# Patient Record
Sex: Female | Born: 1960 | Race: White | Hispanic: No | Smoking: Current every day smoker
Health system: Southern US, Community
[De-identification: ages and names within clinical notes are randomized; demographics above are authoritative.]

## PROBLEM LIST (undated history)

## (undated) DIAGNOSIS — K219 Gastro-esophageal reflux disease without esophagitis: Secondary | ICD-10-CM

## (undated) DIAGNOSIS — I10 Essential (primary) hypertension: Secondary | ICD-10-CM

## (undated) DIAGNOSIS — R112 Nausea with vomiting, unspecified: Secondary | ICD-10-CM

## (undated) DIAGNOSIS — J449 Chronic obstructive pulmonary disease, unspecified: Secondary | ICD-10-CM

## (undated) DIAGNOSIS — G8929 Other chronic pain: Secondary | ICD-10-CM

## (undated) DIAGNOSIS — T8859XA Other complications of anesthesia, initial encounter: Secondary | ICD-10-CM

## (undated) DIAGNOSIS — F419 Anxiety disorder, unspecified: Secondary | ICD-10-CM

## (undated) DIAGNOSIS — M1990 Unspecified osteoarthritis, unspecified site: Secondary | ICD-10-CM

## (undated) DIAGNOSIS — R519 Headache, unspecified: Secondary | ICD-10-CM

## (undated) DIAGNOSIS — Z9889 Other specified postprocedural states: Secondary | ICD-10-CM

## (undated) DIAGNOSIS — T4145XA Adverse effect of unspecified anesthetic, initial encounter: Secondary | ICD-10-CM

## (undated) HISTORY — DX: Gastro-esophageal reflux disease without esophagitis: K21.9

## (undated) HISTORY — PX: APPENDECTOMY: SHX54

## (undated) HISTORY — DX: Anxiety disorder, unspecified: F41.9

## (undated) HISTORY — PX: EXPLORATORY LAPAROTOMY: SUR591

## (undated) HISTORY — PX: TONSILLECTOMY: SUR1361

## (undated) HISTORY — DX: Chronic obstructive pulmonary disease, unspecified: J44.9

## (undated) HISTORY — DX: Other chronic pain: G89.29

## (undated) HISTORY — PX: MANDIBLE FRACTURE SURGERY: SHX706

## (undated) HISTORY — DX: Headache, unspecified: R51.9

---

## 1997-11-06 ENCOUNTER — Emergency Department (HOSPITAL_COMMUNITY): Admission: EM | Admit: 1997-11-06 | Discharge: 1997-11-06 | Payer: Self-pay | Admitting: Emergency Medicine

## 1998-07-25 ENCOUNTER — Inpatient Hospital Stay (HOSPITAL_COMMUNITY): Admission: AD | Admit: 1998-07-25 | Discharge: 1998-07-25 | Payer: Self-pay | Admitting: Obstetrics

## 2011-06-03 ENCOUNTER — Inpatient Hospital Stay (HOSPITAL_COMMUNITY)
Admission: AD | Admit: 2011-06-03 | Discharge: 2011-06-03 | Disposition: A | Discharge status: Home / Self Care | Payer: Self-pay | Source: Ambulatory Visit | Attending: Obstetrics and Gynecology | Admitting: Obstetrics and Gynecology

## 2011-06-03 ENCOUNTER — Encounter (HOSPITAL_COMMUNITY): Payer: Self-pay

## 2011-06-03 DIAGNOSIS — I10 Essential (primary) hypertension: Secondary | ICD-10-CM | POA: Insufficient documentation

## 2011-06-03 DIAGNOSIS — A5901 Trichomonal vulvovaginitis: Secondary | ICD-10-CM | POA: Insufficient documentation

## 2011-06-03 DIAGNOSIS — N938 Other specified abnormal uterine and vaginal bleeding: Secondary | ICD-10-CM | POA: Insufficient documentation

## 2011-06-03 DIAGNOSIS — N390 Urinary tract infection, site not specified: Secondary | ICD-10-CM | POA: Insufficient documentation

## 2011-06-03 DIAGNOSIS — N949 Unspecified condition associated with female genital organs and menstrual cycle: Secondary | ICD-10-CM | POA: Insufficient documentation

## 2011-06-03 HISTORY — DX: Nausea with vomiting, unspecified: R11.2

## 2011-06-03 HISTORY — DX: Other complications of anesthesia, initial encounter: T88.59XA

## 2011-06-03 HISTORY — DX: Adverse effect of unspecified anesthetic, initial encounter: T41.45XA

## 2011-06-03 HISTORY — DX: Other specified postprocedural states: Z98.890

## 2011-06-03 HISTORY — DX: Essential (primary) hypertension: I10

## 2011-06-03 LAB — URINE MICROSCOPIC-ADD ON

## 2011-06-03 LAB — CBC
Hemoglobin: 14.2 g/dL (ref 12.0–15.0)
MCH: 30.6 pg (ref 26.0–34.0)
MCV: 92.5 fL (ref 78.0–100.0)
RBC: 4.64 MIL/uL (ref 3.87–5.11)

## 2011-06-03 LAB — URINALYSIS, ROUTINE W REFLEX MICROSCOPIC
Bilirubin Urine: NEGATIVE
Glucose, UA: NEGATIVE mg/dL
Specific Gravity, Urine: <1.005 — ABNORMAL LOW (ref 1.005–1.030)
pH: 6 (ref 5.0–8.0)

## 2011-06-03 LAB — HCG, SERUM, QUALITATIVE: Preg, Serum: NEGATIVE

## 2011-06-03 MED ORDER — ONDANSETRON 8 MG PO TBDP
8.0000 mg | ORAL_TABLET | Freq: Once | ORAL | Status: AC
  Administered 2011-06-03: 8 mg via ORAL
  Filled 2011-06-03: qty 1

## 2011-06-03 MED ORDER — FLUCONAZOLE 150 MG PO TABS: 150.0000 mg | ORAL_TABLET | Freq: Once | ORAL | Status: AC

## 2011-06-03 MED ORDER — HYDROCHLOROTHIAZIDE 12.5 MG PO CAPS
12.5000 mg | ORAL_CAPSULE | Freq: Once | ORAL | Status: AC
  Administered 2011-06-03: 12.5 mg via ORAL
  Filled 2011-06-03: qty 1

## 2011-06-03 MED ORDER — METRONIDAZOLE 500 MG PO TABS
2000.0000 mg | ORAL_TABLET | Freq: Once | ORAL | Status: DC
  Filled 2011-06-03: qty 4

## 2011-06-03 MED ORDER — HYDROCHLOROTHIAZIDE 12.5 MG PO CAPS: 12.5000 mg | ORAL_CAPSULE | Freq: Every day | ORAL | Status: DC

## 2011-06-03 MED ORDER — KETOROLAC TROMETHAMINE 60 MG/2ML IM SOLN
60.0000 mg | Freq: Once | INTRAMUSCULAR | Status: AC
  Administered 2011-06-03: 60 mg via INTRAMUSCULAR
  Filled 2011-06-03: qty 2

## 2011-06-03 MED ORDER — SULFAMETHOXAZOLE-TRIMETHOPRIM 800-160 MG PO TABS: 1.0000 | ORAL_TABLET | Freq: Two times a day (BID) | ORAL | Status: AC

## 2011-06-03 NOTE — Progress Notes (Signed)
Pain started low back just on left side. Started about a month an a half ago.  Started having discharge about a month ago, was milky colored, then started looking like infection, smelling like infection.  Pinkish on Tues when wiped (has not bled in 2 yrs).  Blood pouring out Tues night, has been getting lighter since, just spotting today.  Now pain is lower left back and cramping in the front. Today is nauseated. (neg CVA tenderness)

## 2011-06-03 NOTE — ED Provider Notes (Signed)
History     Chief Complaint  Patient presents with  . Back Pain  . Vaginal Discharge  . Vaginal Bleeding  . Abdominal Cramping   HPI Cortnee Steinmiller 51 y.o. had bleeding earlier this week.  Has been menopausal for over one year.  Has had heavy vaginal discharge with odor for over one month. Having lower abdominal pain and back pain.  Thinks she has a kidney infection.     OB History    Grav Para Term Preterm Abortions TAB SAB Ect Mult Living   1 1 1  0 0 0 0 0 0 1      Past Medical History  Diagnosis Date  . Hypertension     HBP at [employer] machine but never been to doctor for it.   . Complication of anesthesia   . PONV (postoperative nausea and vomiting)     Past Surgical History  Procedure Date  . Appendectomy   . Tonsillectomy   . Mandible fracture surgery     Family History  Problem Relation Age of Onset  . Anesthesia problems Neg Hx     History  Substance Use Topics  . Smoking status: Current Everyday Smoker    Types: Cigarettes  . Smokeless tobacco: Not on file  . Alcohol Use: Yes    Allergies:  Allergies  Allergen Reactions  . Vicodin (Hydrocodone-Acetaminophen) Itching    Prescriptions prior to admission  Medication Sig Dispense Refill  . CRANBERRY PO Take 1 tablet by mouth daily.      . Ibuprofen-Diphenhydramine Cit (IBUPROFEN PM PO) Take 1 tablet by mouth at bedtime as needed. Takes for sleep        Review of Systems  Constitutional: Negative for fever.  Gastrointestinal: Positive for nausea and abdominal pain. Negative for vomiting.  Genitourinary:       Vaginal discharge Vaginal bleeding  Musculoskeletal: Positive for back pain.   Physical Exam   Blood pressure 150/98, pulse 76, temperature 98.6 F (37 C), temperature source Oral, resp. rate 20, height 5\' 2"  (1.575 m), weight 150 lb (68.04 kg), SpO2 99.00%.  Physical Exam  Nursing note and vitals reviewed. Constitutional: She is oriented to person, place, and time. She appears  well-developed and well-nourished.  HENT:  Head: Normocephalic.  Eyes: EOM are normal.  Neck: Neck supple.  GI: Soft. There is tenderness. There is no rebound and no guarding.       Mild diffuse lower abdominal tenderness  Genitourinary:       Speculum exam: Vagina - Mod amount of yellow liquid discharge, no odor Cervix - No contact bleeding Bimanual exam: Cervix closed Uterus non tender, normal size Adnexa non tender, no masses bilaterally GC/Chlam, wet prep done Chaperone present for exam.  Musculoskeletal: Normal range of motion.  Neurological: She is alert and oriented to person, place, and time.  Skin: Skin is warm and dry.  Psychiatric: She has a normal mood and affect.    MAU Course  Procedures Results for orders placed during the hospital encounter of 06/03/11 (from the past 24 hour(s))  URINALYSIS, ROUTINE W REFLEX MICROSCOPIC     Status: Abnormal   Collection Time   06/03/11  6:20 PM      Component Value Range   Color, Urine STRAW (*) YELLOW    APPearance CLEAR  CLEAR    Specific Gravity, Urine <1.005 (*) 1.005 - 1.030    pH 6.0  5.0 - 8.0    Glucose, UA NEGATIVE  NEGATIVE (mg/dL)   Hgb  urine dipstick MODERATE (*) NEGATIVE    Bilirubin Urine NEGATIVE  NEGATIVE    Ketones, ur NEGATIVE  NEGATIVE (mg/dL)   Protein, ur NEGATIVE  NEGATIVE (mg/dL)   Urobilinogen, UA 0.2  0.0 - 1.0 (mg/dL)   Nitrite NEGATIVE  NEGATIVE    Leukocytes, UA SMALL (*) NEGATIVE   URINE MICROSCOPIC-ADD ON     Status: Abnormal   Collection Time   06/03/11  6:20 PM      Component Value Range   Squamous Epithelial / LPF FEW (*) RARE    WBC, UA 11-20  <3 (WBC/hpf)   RBC / HPF 3-6  <3 (RBC/hpf)   Bacteria, UA MANY (*) RARE    Urine-Other TRICHOMONAS PRESENT    CBC     Status: Normal   Collection Time   06/03/11  8:31 PM      Component Value Range   WBC 8.7  4.0 - 10.5 (K/uL)   RBC 4.64  3.87 - 5.11 (MIL/uL)   Hemoglobin 14.2  12.0 - 15.0 (g/dL)   HCT 01.0  27.2 - 53.6 (%)   MCV 92.5  78.0 -  100.0 (fL)   MCH 30.6  26.0 - 34.0 (pg)   MCHC 33.1  30.0 - 36.0 (g/dL)   RDW 64.4  03.4 - 74.2 (%)   Platelets 221  150 - 400 (K/uL)  HCG, SERUM, QUALITATIVE     Status: Normal   Collection Time   06/03/11  8:31 PM      Component Value Range   Preg, Serum NEGATIVE  NEGATIVE   WET PREP, GENITAL     Status: Abnormal   Collection Time   06/03/11  8:46 PM      Component Value Range   Yeast Wet Prep HPF POC NONE SEEN  NONE SEEN    Trich, Wet Prep MANY (*) NONE SEEN    Clue Cells Wet Prep HPF POC RARE (*) NONE SEEN    WBC, Wet Prep HPF POC FEW (*) NONE SEEN     MDM Has pressure when palpating bladder.  Thinks she has a kidney infection but reassured as she has no CVA tenderness.  Will treat for UTI as she has pressure over bladder on bimanual.  Blood pressure went up to 210/114 after getting STD diagnosis.  Will begin to treat BP.  Client has already expressed she does not have money for medication so will give HCTz as it is inexpensive and will begin to treat BP although she may need other medications for control of BP.  Was given zofran one dose in MAU.  Assessment and Plan  Trichomonas UTI Hypertension  Plan rx septra ds one po bid x 3 days for uti rx HCTZ 12.5 mg po q day rx diflucan 150 mg one po as a single dose if develops a yeast infection. Treatment given for trich while in MAU Discussed referring partner for treatment No sex for 10 days Advised to [facility] of hypertension Will send message to GYN clinic as client has not had pap in years.  BURLESON,TERRI 06/03/2011, 8:44 PM   Nolene Bernheim, NP 06/03/11 2247

## 2011-06-04 LAB — GC/CHLAMYDIA PROBE AMP, GENITAL: GC Probe Amp, Genital: NEGATIVE

## 2011-06-27 ENCOUNTER — Encounter: Payer: Self-pay | Admitting: Advanced Practice Midwife

## 2013-03-23 ENCOUNTER — Emergency Department (HOSPITAL_COMMUNITY): Payer: Self-pay

## 2013-03-23 ENCOUNTER — Encounter (HOSPITAL_COMMUNITY): Payer: Self-pay | Admitting: Emergency Medicine

## 2013-03-23 ENCOUNTER — Inpatient Hospital Stay (HOSPITAL_COMMUNITY)
Admission: EM | Admit: 2013-03-23 | Discharge: 2013-03-24 | DRG: 392 | Disposition: A | Discharge status: Home / Self Care | Payer: Self-pay | Attending: Family Medicine | Admitting: Family Medicine

## 2013-03-23 DIAGNOSIS — K2970 Gastritis, unspecified, without bleeding: Principal | ICD-10-CM | POA: Diagnosis present

## 2013-03-23 DIAGNOSIS — K5900 Constipation, unspecified: Secondary | ICD-10-CM | POA: Diagnosis present

## 2013-03-23 DIAGNOSIS — I10 Essential (primary) hypertension: Secondary | ICD-10-CM | POA: Diagnosis present

## 2013-03-23 DIAGNOSIS — E876 Hypokalemia: Secondary | ICD-10-CM | POA: Diagnosis present

## 2013-03-23 DIAGNOSIS — E860 Dehydration: Secondary | ICD-10-CM

## 2013-03-23 DIAGNOSIS — F329 Major depressive disorder, single episode, unspecified: Secondary | ICD-10-CM | POA: Diagnosis present

## 2013-03-23 DIAGNOSIS — F3289 Other specified depressive episodes: Secondary | ICD-10-CM | POA: Diagnosis present

## 2013-03-23 DIAGNOSIS — F1010 Alcohol abuse, uncomplicated: Secondary | ICD-10-CM | POA: Diagnosis present

## 2013-03-23 DIAGNOSIS — F411 Generalized anxiety disorder: Secondary | ICD-10-CM | POA: Diagnosis present

## 2013-03-23 DIAGNOSIS — F17200 Nicotine dependence, unspecified, uncomplicated: Secondary | ICD-10-CM | POA: Diagnosis present

## 2013-03-23 DIAGNOSIS — K2920 Alcoholic gastritis without bleeding: Secondary | ICD-10-CM

## 2013-03-23 LAB — CBC WITH DIFFERENTIAL/PLATELET
Basophils Relative: 1 % (ref 0–1)
HCT: 47.1 % — ABNORMAL HIGH (ref 36.0–46.0)
Hemoglobin: 17.1 g/dL — ABNORMAL HIGH (ref 12.0–15.0)
Lymphs Abs: 1.7 10*3/uL (ref 0.7–4.0)
MCH: 31.6 pg (ref 26.0–34.0)
MCHC: 36.3 g/dL — ABNORMAL HIGH (ref 30.0–36.0)
Monocytes Absolute: 0.9 10*3/uL (ref 0.1–1.0)
Monocytes Relative: 10 % (ref 3–12)
Neutro Abs: 6.3 10*3/uL (ref 1.7–7.7)
RBC: 5.41 MIL/uL — ABNORMAL HIGH (ref 3.87–5.11)

## 2013-03-23 LAB — COMPREHENSIVE METABOLIC PANEL
Albumin: 4.6 g/dL (ref 3.5–5.2)
BUN: 9 mg/dL (ref 6–23)
Chloride: 97 mEq/L (ref 96–112)
Creatinine, Ser: 0.86 mg/dL (ref 0.50–1.10)
GFR calc Af Amer: 88 mL/min — ABNORMAL LOW (ref >90)
GFR calc non Af Amer: 76 mL/min — ABNORMAL LOW (ref >90)
Glucose, Bld: 81 mg/dL (ref 70–99)
Total Bilirubin: 0.7 mg/dL (ref 0.3–1.2)

## 2013-03-23 LAB — URINE MICROSCOPIC-ADD ON

## 2013-03-23 LAB — LIPASE, BLOOD: Lipase: 24 U/L (ref 11–59)

## 2013-03-23 LAB — POCT I-STAT TROPONIN I: Troponin i, poc: 0 ng/mL (ref 0.00–0.08)

## 2013-03-23 LAB — URINALYSIS, ROUTINE W REFLEX MICROSCOPIC
Bilirubin Urine: NEGATIVE
Ketones, ur: NEGATIVE mg/dL
Leukocytes, UA: NEGATIVE
Nitrite: NEGATIVE
Protein, ur: NEGATIVE mg/dL
Urobilinogen, UA: 1 mg/dL (ref 0.0–1.0)

## 2013-03-23 LAB — POCT PREGNANCY, URINE: Preg Test, Ur: NEGATIVE

## 2013-03-23 IMAGING — CR DG ABDOMEN ACUTE W/ 1V CHEST
3 series · 3 of 3 positions shown · non-contrast
Comparison: None.

CLINICAL DATA: Abdominal pain.

EXAM:
ACUTE ABDOMEN SERIES (ABDOMEN 2 VIEW & CHEST 1 VIEW)

[w chest pa]
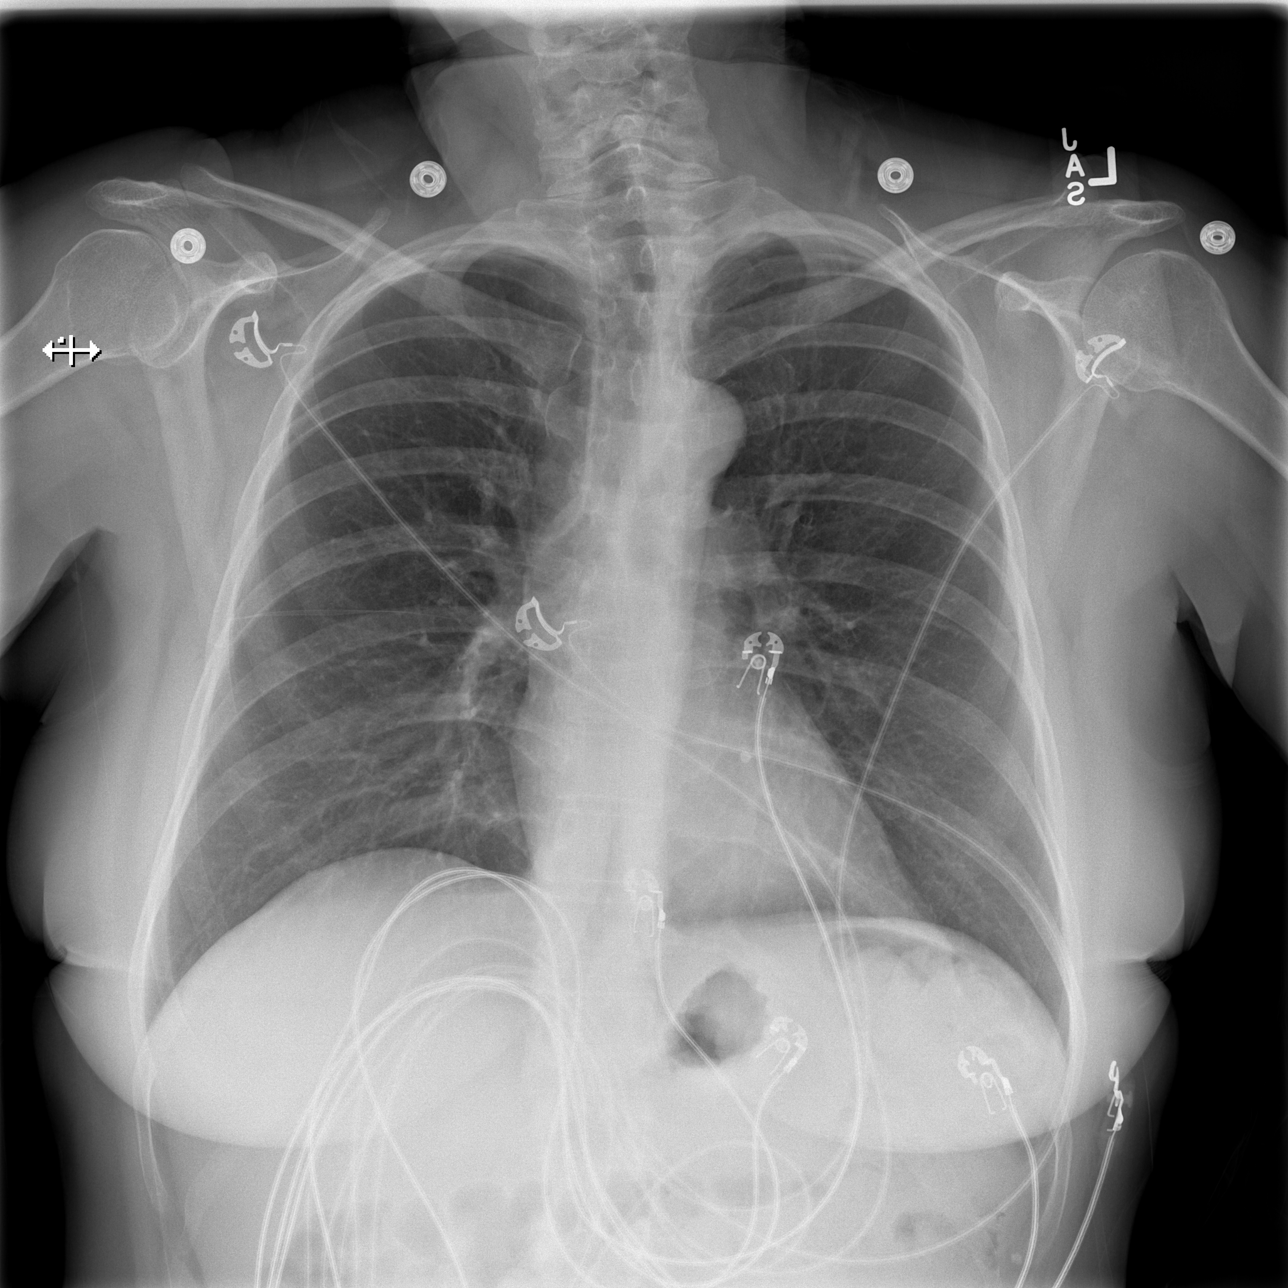

[w abdomen upright]
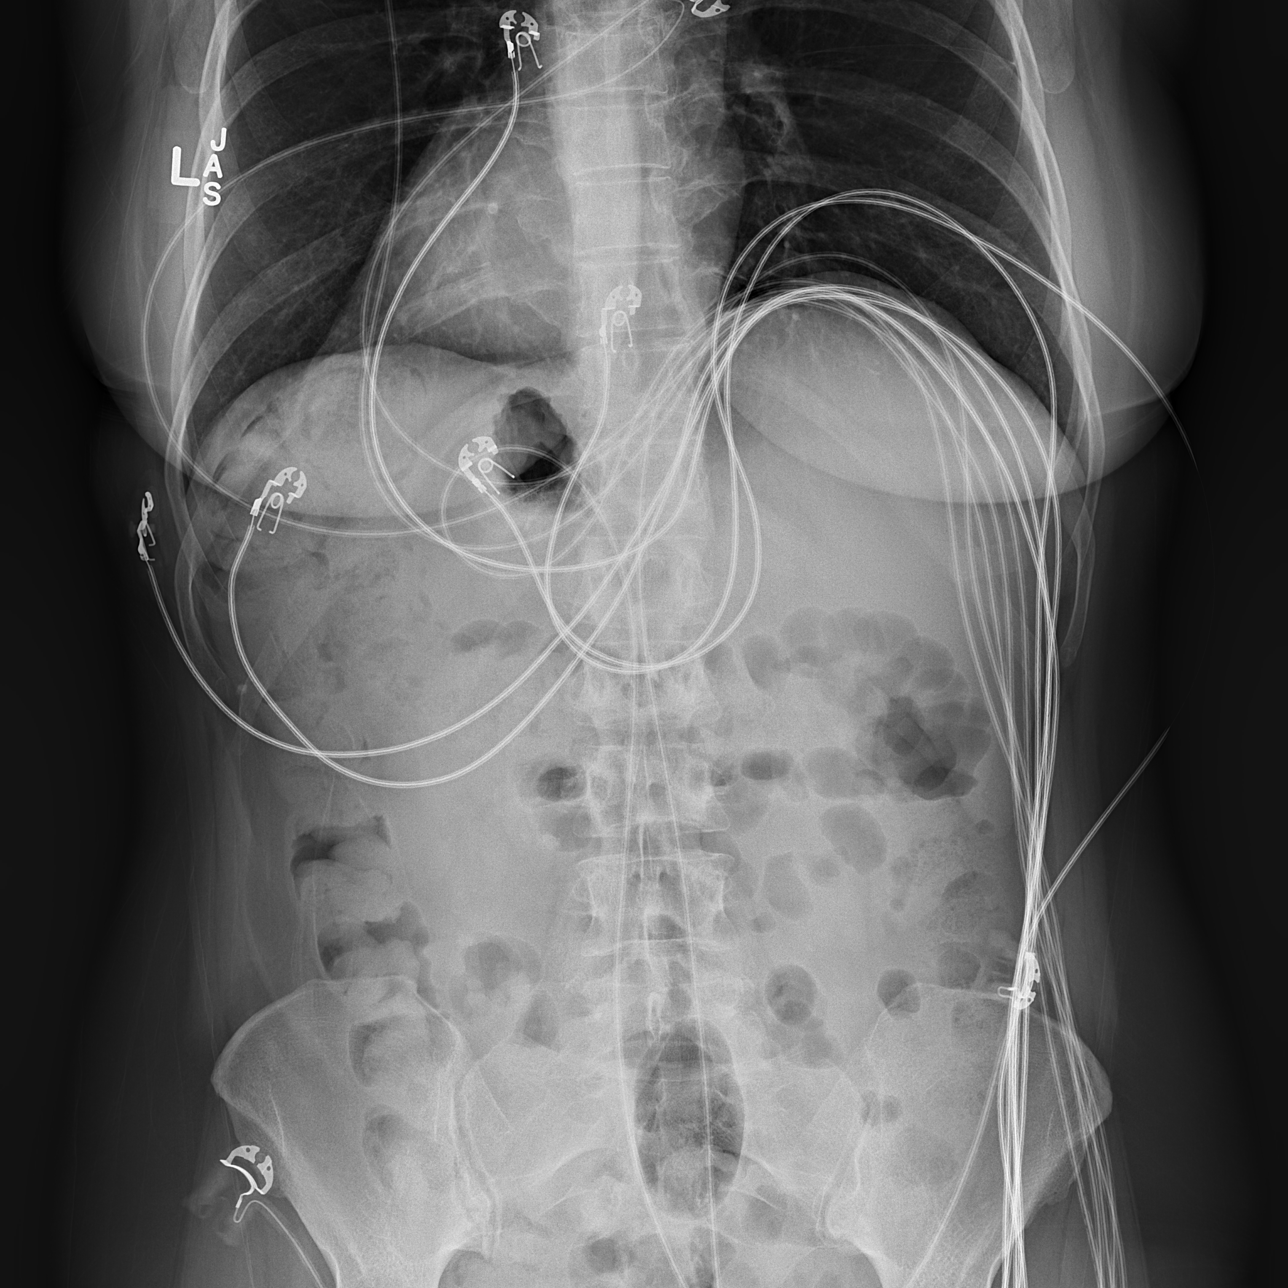

[x abdomen supine]
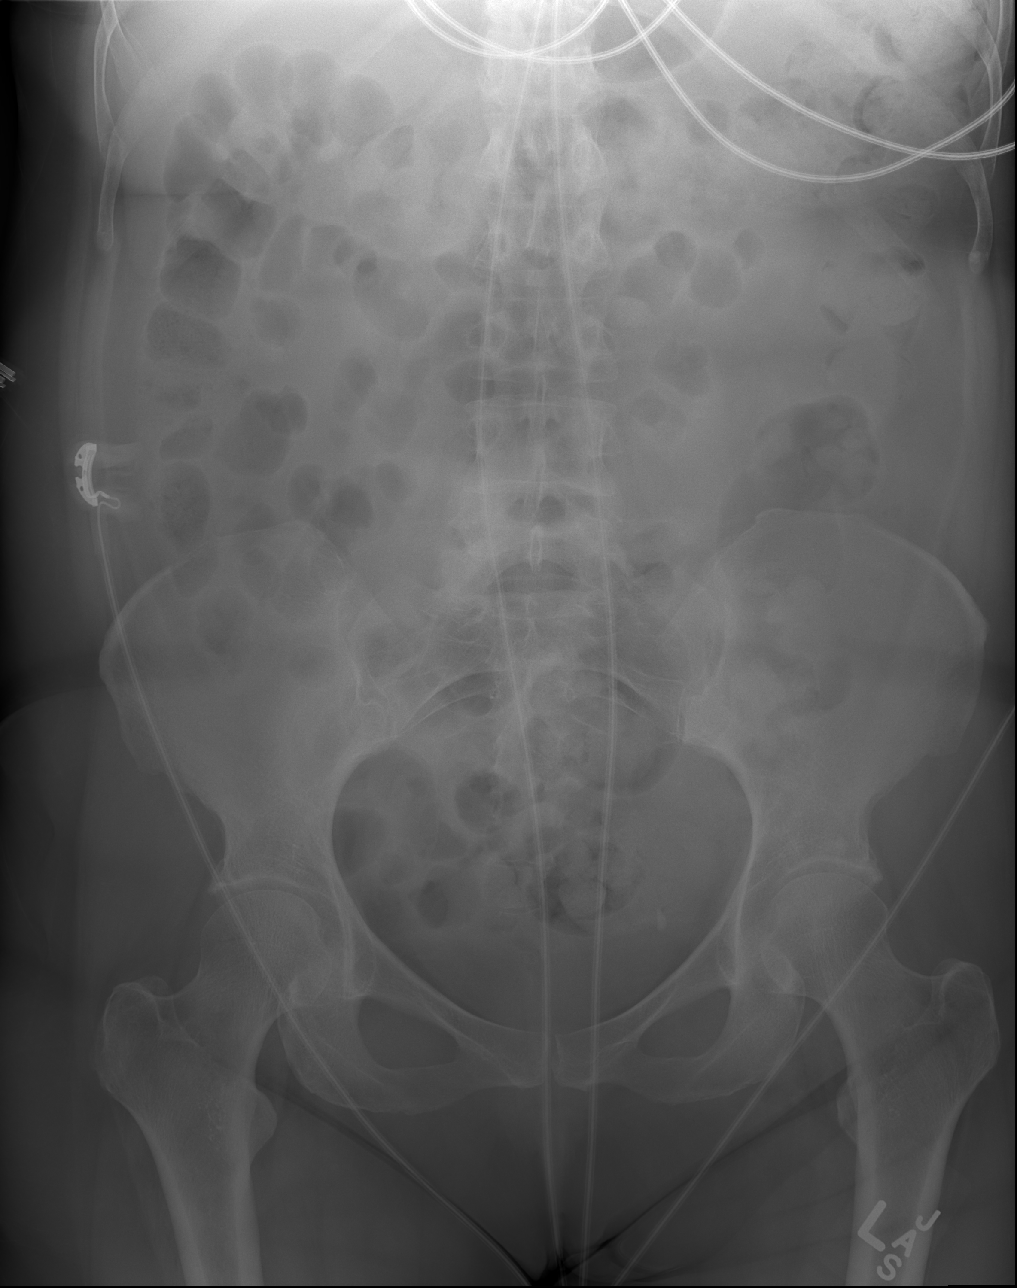

[3 of 3 positions shown; findings below may reference images not displayed]

FINDINGS: There is no evidence of dilated bowel loops or free intraperitoneal
air. No radiopaque calculi or other significant radiographic
abnormality is seen. Heart size and mediastinal contours are within
normal limits. Both lungs are clear.
IMPRESSION: Negative abdominal radiographs.  No acute cardiopulmonary disease.

## 2013-03-23 MED ORDER — ADULT MULTIVITAMIN W/MINERALS CH
1.0000 | ORAL_TABLET | Freq: Every day | ORAL | Status: DC
  Administered 2013-03-23 – 2013-03-24 (×2): 1 via ORAL
  Filled 2013-03-23 (×2): qty 1

## 2013-03-23 MED ORDER — TRAZODONE HCL 50 MG PO TABS: 50.0000 mg | ORAL_TABLET | Freq: Every day | ORAL | Status: DC

## 2013-03-23 MED ORDER — TRAZODONE HCL 50 MG PO TABS
50.0000 mg | ORAL_TABLET | Freq: Every day | ORAL | Status: DC
  Administered 2013-03-23: 50 mg via ORAL
  Filled 2013-03-23 (×2): qty 1

## 2013-03-23 MED ORDER — ONDANSETRON HCL 4 MG/2ML IJ SOLN
4.0000 mg | Freq: Four times a day (QID) | INTRAMUSCULAR | Status: DC | PRN
  Administered 2013-03-23 – 2013-03-24 (×2): 4 mg via INTRAVENOUS
  Filled 2013-03-23 (×2): qty 2

## 2013-03-23 MED ORDER — LORAZEPAM 1 MG PO TABS
1.0000 mg | ORAL_TABLET | Freq: Four times a day (QID) | ORAL | Status: DC | PRN
  Administered 2013-03-23 – 2013-03-24 (×2): 1 mg via ORAL
  Filled 2013-03-23 (×2): qty 1

## 2013-03-23 MED ORDER — POTASSIUM CHLORIDE 10 MEQ/100ML IV SOLN
10.0000 meq | INTRAVENOUS | Status: DC
  Administered 2013-03-23: 10 meq via INTRAVENOUS
  Filled 2013-03-23 (×3): qty 100

## 2013-03-23 MED ORDER — ACETAMINOPHEN 325 MG PO TABS
650.0000 mg | ORAL_TABLET | Freq: Four times a day (QID) | ORAL | Status: DC | PRN
  Administered 2013-03-23 – 2013-03-24 (×2): 650 mg via ORAL
  Filled 2013-03-23 (×2): qty 2

## 2013-03-23 MED ORDER — AMLODIPINE BESYLATE 10 MG PO TABS: 10.0000 mg | ORAL_TABLET | Freq: Every day | ORAL | Status: DC

## 2013-03-23 MED ORDER — POTASSIUM CHLORIDE CRYS ER 20 MEQ PO TBCR
40.0000 meq | EXTENDED_RELEASE_TABLET | Freq: Once | ORAL | Status: AC
  Administered 2013-03-23: 40 meq via ORAL
  Filled 2013-03-23: qty 2

## 2013-03-23 MED ORDER — HEPARIN SODIUM (PORCINE) 5000 UNIT/ML IJ SOLN
5000.0000 [IU] | Freq: Three times a day (TID) | INTRAMUSCULAR | Status: DC
  Administered 2013-03-23 – 2013-03-24 (×4): 5000 [IU] via SUBCUTANEOUS
  Filled 2013-03-23 (×6): qty 1

## 2013-03-23 MED ORDER — DIPHENHYDRAMINE HCL 50 MG/ML IJ SOLN
25.0000 mg | Freq: Once | INTRAMUSCULAR | Status: AC
  Administered 2013-03-23: 25 mg via INTRAVENOUS
  Filled 2013-03-23: qty 1

## 2013-03-23 MED ORDER — LORAZEPAM 2 MG/ML IJ SOLN: 1.0000 mg | Freq: Four times a day (QID) | INTRAMUSCULAR | Status: DC | PRN

## 2013-03-23 MED ORDER — FOLIC ACID 1 MG PO TABS
1.0000 mg | ORAL_TABLET | Freq: Every day | ORAL | Status: DC
  Administered 2013-03-23 – 2013-03-24 (×2): 1 mg via ORAL
  Filled 2013-03-23 (×2): qty 1

## 2013-03-23 MED ORDER — METOCLOPRAMIDE HCL 5 MG/ML IJ SOLN
10.0000 mg | Freq: Once | INTRAMUSCULAR | Status: AC
  Administered 2013-03-23: 10 mg via INTRAVENOUS
  Filled 2013-03-23: qty 2

## 2013-03-23 MED ORDER — ONDANSETRON HCL 4 MG/2ML IJ SOLN: 4.0000 mg | Freq: Once | INTRAMUSCULAR | Status: DC

## 2013-03-23 MED ORDER — THIAMINE HCL 100 MG/ML IJ SOLN
100.0000 mg | Freq: Every day | INTRAMUSCULAR | Status: DC
  Filled 2013-03-23: qty 1

## 2013-03-23 MED ORDER — POTASSIUM CHLORIDE 10 MEQ/100ML IV SOLN
10.0000 meq | INTRAVENOUS | Status: DC
  Administered 2013-03-23 (×2): 10 meq via INTRAVENOUS
  Filled 2013-03-23 (×2): qty 100

## 2013-03-23 MED ORDER — PANTOPRAZOLE SODIUM 40 MG IV SOLR
40.0000 mg | Freq: Once | INTRAVENOUS | Status: AC
  Administered 2013-03-23: 40 mg via INTRAVENOUS
  Filled 2013-03-23: qty 40

## 2013-03-23 MED ORDER — FAMOTIDINE IN NACL 20-0.9 MG/50ML-% IV SOLN
20.0000 mg | Freq: Once | INTRAVENOUS | Status: AC
  Administered 2013-03-23: 20 mg via INTRAVENOUS
  Filled 2013-03-23: qty 50

## 2013-03-23 MED ORDER — VITAMIN B-1 100 MG PO TABS
100.0000 mg | ORAL_TABLET | Freq: Every day | ORAL | Status: DC
  Administered 2013-03-23 – 2013-03-24 (×2): 100 mg via ORAL
  Filled 2013-03-23 (×2): qty 1

## 2013-03-23 MED ORDER — ONDANSETRON HCL 4 MG PO TABS: 4.0000 mg | ORAL_TABLET | Freq: Four times a day (QID) | ORAL | Status: DC | PRN

## 2013-03-23 MED ORDER — SODIUM CHLORIDE 0.9 % IV BOLUS (SEPSIS)
1000.0000 mL | Freq: Once | INTRAVENOUS | Status: AC
  Administered 2013-03-23: 1000 mL via INTRAVENOUS

## 2013-03-23 MED ORDER — PANTOPRAZOLE SODIUM 40 MG IV SOLR
40.0000 mg | Freq: Two times a day (BID) | INTRAVENOUS | Status: DC
  Administered 2013-03-23: 40 mg via INTRAVENOUS
  Filled 2013-03-23 (×3): qty 40

## 2013-03-23 MED ORDER — DEXTROSE-NACL 5-0.45 % IV SOLN
INTRAVENOUS | Status: DC
  Administered 2013-03-23 – 2013-03-24 (×2): via INTRAVENOUS
  Administered 2013-03-24: 10 mL/h via INTRAVENOUS

## 2013-03-23 MED ORDER — GI COCKTAIL ~~LOC~~
30.0000 mL | Freq: Once | ORAL | Status: AC
  Administered 2013-03-23: 30 mL via ORAL
  Filled 2013-03-23: qty 30

## 2013-03-23 MED ORDER — SODIUM CHLORIDE 0.9 % IV SOLN: INTRAVENOUS | Status: DC

## 2013-03-23 MED ORDER — THIAMINE HCL 100 MG/ML IJ SOLN
100.0000 mg | Freq: Every day | INTRAMUSCULAR | Status: DC
  Filled 2013-03-23 (×2): qty 1

## 2013-03-23 MED ORDER — ALPRAZOLAM 0.5 MG PO TABS: 0.5000 mg | ORAL_TABLET | Freq: Three times a day (TID) | ORAL | Status: DC | PRN

## 2013-03-23 NOTE — ED Notes (Signed)
Pt requesting something to eat.  Explained to her that she had an order for NPO and that she could not eat at this time.  Pt voices understanding.

## 2013-03-23 NOTE — ED Notes (Signed)
Pt states that since 11/7 she has been vomiting and having abdominal pain.  Pt states one time since 11/7 she has vomited blood and one time her vomit was black.  Pt states she has had increasing weakness.  Pt states she has a lack of appetite.

## 2013-03-23 NOTE — H&P (Signed)
Family Medicine Teaching [facility] Admission History and Physical Service Pager: [phone]  Patient name: Julie Hensley Medical record number: [id number] Date of birth: 12-10-1960 Age: 52 y.o. Gender: female  Primary Care Provider: No primary provider on file. Consultants: none Code Status: Full  Chief Complaint: nausea, vomiting and abdominal pain  Assessment and Plan: Julie Hensley is a 52 y.o. female presenting with nausea, vomiting and epigastric abdominal pain. PMH is significant for alcohol abuse, reported diagnosis of Peptic ulcer disease  # Nausea/Vomiting/Abdominal pain: with patient reported "black" emesis. Gatroccult was negative and hemoglobin was elevated (although possibly falsely high due to dehydration) which is reassuring and makes upper GI bleed less likely.  Differential includes PUD vs alcoholic gastritis vs hiatal hernia. No h/o diabetes making gastroparesis less likely. Normal LFT's and normal WBC making gallstone etiology less likely as well. With the description of improvement of symptoms with sitting up, hiatal hernia seems possible - continue protonix IV for now - zofran for nausea - D5/12 NS at 100cc/hr - NPO except for ice chips - if not improved, consider abdominal US  - repeat CBC in am for monitoring of Hg and WBC - encouraged discontinuation of goodie powder which patient takes a lot of for her headache  # Hypokalemia: likely secondary to emesis. No QT prolongation or obvious U wave on EKG but continue to monitor on tele - replete with IV K and oral Kdur - repeat in evening of admission - monitor on tele - repeat EKG in am  # Hypercalcemia: unclear etiology - check TSH, PTH, vitamin D  # Alcohol abuse: reportedly, patient has not been drinking recently since onset of her GI symptoms. It is unclear as to whether she would like to quit.  - CIWA protocol with ativan prn - thiamine, multivitamin, folate - social work consult for resources for  quitting if patient interested  # Anxiety: on xanax at home - ativan per CIWA protocol - resume home xanax prn - trazadone started in hospital for sleep at patient's request  # HTN: normotensive in the 110's systolic - hold home BP meds: amlodipine and HCTZ for now  FEN/GI: ice chips, D5 1/2NS at 100cc/hr Prophylaxis: heparin  Disposition: admit to tele. Patient and her mother updated at bedside  History of Present Illness: Julie Hensley is a 52 y.o. female presenting with nausea, vomiting, abdominal pain. Symptoms started a couple months ago, but were occasional and self resolving. She states that 3 weeks ago she noticed blood mixed in her emesis. For one week, she has been having symptoms of nausea, vomiting, abdominal pain every day. She states she has around 4-5 episodes of emesis per day. She reports seen black matter in emesis this last Thursday. She denies any blood in stool or melena. She reports that her pain is located in the mid epigastric region. It is a sharp pain that is also associated with a sensation of choking. Pain occurs 1 hour after eating. She states that before it was only after she ate. Now it is a constant pain. Pain and vomiting are worse with laying flat. She has been sleeping propped up in a seated position for 2 days to help with her symptoms. She states that she takes a regular basis at home for headaches or Advil. She is a chronic drinker. She reports drinking up to a case a day. She stopped drinking when her symptoms became worse and when she had not been able to keep anything down. She has recently been  constipated. Last bowel movement was Thursday and consisted in small balls of hard stool. She has been able to keep some fluids down but overall has had decreased oral intake for both fluid and solid foods. She reports being diagnosed with a peptic ulcer around 2000 but has not been on any medicine since.   She also reports a sensation of vibration in her hands  bilaterally that occurred yesterday for 5 minutes. It resolved on its own. She then says that she had some lower extremity numbness. She denies any chest pain, palpitations. She reports that she sometimes has shortness of breath with walking.  In the ED, she received Reglan IV x1, Protonix IV x1, Pepcid IV x1 and a GI cocktail. She had some improvement of symptoms. She was also found to be hypokalemic with a potassium of 2.4 and was started on IV runs of potassium.  Review Of Systems: Per HPI with the following additions: None Otherwise 12 point review of systems was performed and was unremarkable.  There are no active problems to display for this patient.  Past Medical History: Past Medical History  Diagnosis Date  . Hypertension     HBP at [employer] machine but never been to doctor for it.   . Complication of anesthesia   . PONV (postoperative nausea and vomiting)    Past Surgical History: Past Surgical History  Procedure Laterality Date  . Appendectomy    . Tonsillectomy    . Mandible fracture surgery     Social History: History  Substance Use Topics  . Smoking status: Current Every Day Smoker    Types: Cigarettes  . Smokeless tobacco: Not on file  . Alcohol Use: Yes     Comment: has not had alcohol in a month.  Previously a case of beer daily   Additional social history: none Please also refer to relevant sections of EMR.  Family History: Family History  Problem Relation Age of Onset  . Anesthesia problems Neg Hx    Allergies and Medications: Allergies  Allergen Reactions  . Vicodin [Hydrocodone-Acetaminophen] Itching   No current facility-administered medications on file prior to encounter.   Current Outpatient Prescriptions on File Prior to Encounter  Medication Sig Dispense Refill  . Ibuprofen-Diphenhydramine Cit (IBUPROFEN PM PO) Take 1 tablet by mouth at bedtime as needed. Takes for sleep        Objective: BP 111/83  Pulse 68  Temp(Src) 99 F (37.2 C)  (Oral)  Resp 16  Ht 5\' 2"  (1.575 m)  Wt 155 lb 8 oz (70.534 kg)  BMI 28.43 kg/m2  SpO2 95% Exam: General: No acute distress, conversant white middle-aged female. HEENT: Dry mucous membranes, pupils equal round and reactive to light and accommodation, oropharynx clear Cardiovascular: S1-S2, regular rate and rhythm, no murmur appreciated Respiratory: Clear to auscultation bilaterally Abdomen: Soft, nondistended, bowel sounds present, tenderness along the epigastric region, negative Murphy's, no rebound, no guarding Extremities: No artery edema Skin: No rashes, intact Neuro: No focal deficits, cranial nerves II through XII grossly intact.  Labs and Imaging: CBC BMET   Recent Labs Lab 03/23/13 1139  WBC 9.1  HGB 17.1*  HCT 47.1*  PLT 263    Recent Labs Lab 03/23/13 1139  NA 140  K 2.4*  CL 97  CO2 28  BUN 9  CREATININE 0.86  GLUCOSE 81  CALCIUM 11.6*     Acute abdominal series: 03/23/13 FINDINGS:  There is no evidence of dilated bowel loops or free intraperitoneal  air. No  radiopaque calculi or other significant radiographic  abnormality is seen. Heart size and mediastinal contours are within  normal limits. Both lungs are clear.  IMPRESSION:  Negative abdominal radiographs. No acute cardiopulmonary disease.    Lonia Skinner, MD 03/23/2013, 8:07 PM PGY-3, [facility] FPTS Intern pager: [phone], text pages welcome

## 2013-03-23 NOTE — ED Provider Notes (Signed)
CSN: [id number]     Arrival date & time 03/23/13  1052 History   First MD Initiated Contact with Patient 03/23/13 1137     Chief Complaint  Patient presents with  . Weakness  . Emesis  . Abdominal Pain  . Headache   (Consider location/radiation/quality/duration/timing/severity/associated sxs/prior Treatment) The history is provided by the patient.  Pleshette Tomasini is a 52 y.o. female history of hypertension, chronic alcoholic with history of reflux, presenting with vomiting and abdominal pain. She's been vomiting every day for the last 3 weeks. An episode of bloody vomit 3 weeks ago and also another episode of black vomit yesterday. Otherwise he usually is nonbilious and nonbloody. She feels nauseous and had some epigastric pain as well. No fevers or chills but does have some headaches. She has not seen her doctor has no followup. She says she usually drinks as much as she can but quit about a month ago because of increasing abdominal pain.   Past Medical History  Diagnosis Date  . Hypertension     HBP at [employer] machine but never been to doctor for it.   . Complication of anesthesia   . PONV (postoperative nausea and vomiting)    Past Surgical History  Procedure Laterality Date  . Appendectomy    . Tonsillectomy    . Mandible fracture surgery     Family History  Problem Relation Age of Onset  . Anesthesia problems Neg Hx    History  Substance Use Topics  . Smoking status: Current Every Day Smoker    Types: Cigarettes  . Smokeless tobacco: Not on file  . Alcohol Use: Yes     Comment: has not had alcohol in a month.  Previously a case of beer daily   OB History   Grav Para Term Preterm Abortions TAB SAB Ect Mult Living   1 1 1  0 0 0 0 0 0 1     Review of Systems  Gastrointestinal: Positive for vomiting and abdominal pain.  Neurological: Positive for weakness and headaches.  All other systems reviewed and are negative.    Allergies  Vicodin  Home Medications    Current Outpatient Rx  Name  Route  Sig  Dispense  Refill  . ALPRAZolam (XANAX) 0.5 MG tablet   Oral   Take 0.5 mg by mouth 3 (three) times daily as needed for anxiety.         Marland Kitchen amLODipine (NORVASC) 10 MG tablet   Oral   Take 10 mg by mouth daily.         . Ascorbic Acid Buffered (BUFFERED C POWDER PO)   Oral   Take 1 Package by mouth every 8 (eight) hours as needed.         . hydrochlorothiazide (HYDRODIURIL) 25 MG tablet   Oral   Take 25 mg by mouth daily.         . Ibuprofen-Diphenhydramine Cit (IBUPROFEN PM PO)   Oral   Take 1 tablet by mouth at bedtime as needed. Takes for sleep          BP 136/95  Pulse 99  Temp(Src) 98.7 F (37.1 C) (Oral)  Resp 14  Ht 5\' 3"  (1.6 m)  Wt 146 lb (66.225 kg)  BMI 25.87 kg/m2  SpO2 98% Physical Exam  Nursing note and vitals reviewed. Constitutional: She is oriented to person, place, and time.  Dehydrated, uncomfortable   HENT:  Head: Normocephalic.  MM dry   Eyes: Conjunctivae are normal. Pupils  are equal, round, and reactive to light.  Neck: Normal range of motion. Neck supple.  Cardiovascular: Regular rhythm and normal heart sounds.   Tachycardic   Pulmonary/Chest: Effort normal and breath sounds normal. No respiratory distress. She has no wheezes. She has no rales.  Abdominal: Soft. Bowel sounds are normal.  + epigastric tenderness, no rebound   Musculoskeletal: Normal range of motion. She exhibits no edema and no tenderness.  Neurological: She is alert and oriented to person, place, and time. No cranial nerve deficit. Coordination normal.  Skin: Skin is warm and dry.  Psychiatric: She has a normal mood and affect. Her behavior is normal. Judgment and thought content normal.    ED Course  Procedures (including critical care time) Labs Review Labs Reviewed  CBC WITH DIFFERENTIAL - Abnormal; Notable for the following:    RBC 5.41 (*)    Hemoglobin 17.1 (*)    HCT 47.1 (*)    MCHC 36.3 (*)    All other  components within normal limits  COMPREHENSIVE METABOLIC PANEL - Abnormal; Notable for the following:    Potassium 2.4 (*)    Calcium 11.6 (*)    GFR calc non Af Amer 76 (*)    GFR calc Af Amer 88 (*)    All other components within normal limits  LIPASE, BLOOD  URINALYSIS, ROUTINE W REFLEX MICROSCOPIC  OCCULT BLOOD, POC DEVICE  POCT PREGNANCY, URINE   Imaging Review Dg Abd Acute W/chest  03/23/2013   CLINICAL DATA:  Abdominal pain.  EXAM: ACUTE ABDOMEN SERIES (ABDOMEN 2 VIEW & CHEST 1 VIEW)  COMPARISON:  None.  FINDINGS: There is no evidence of dilated bowel loops or free intraperitoneal air. No radiopaque calculi or other significant radiographic abnormality is seen. Heart size and mediastinal contours are within normal limits. Both lungs are clear.  IMPRESSION: Negative abdominal radiographs.  No acute cardiopulmonary disease.   Electronically Signed   By: Salome Holmes M.D.   On: 03/23/2013 14:04    EKG Interpretation    Date/Time:  Saturday March 23 2013 13:11:18 EST Ventricular Rate:  76 PR Interval:  150 QRS Duration: 106 QT Interval:  420 QTC Calculation: 472 R Axis:   20 Text Interpretation:  Sinus rhythm Low voltage, precordial leads Abnormal R-wave progression, early transition Borderline T abnormalities, diffuse leads No previous ECGs available Confirmed by YAO  MD, DAVID [phone]) on 03/23/2013 1:14:57 PM            MDM  No diagnosis found. Eknoor Novack is a 52 y.o. female here with ab pain, vomiting. No history of varices but she is chronic alcoholic. I am concerned of possible varices vs gastric ulcers vs worsening reflux. Will get labs, guiac. Will hydrate and reassess.   2:18 PM Occ neg. Hg stable. Potassium 2.4 with nonspecific EKG changes. Loaded with K. Will admit for hydration, hypokalemia on tele under family medicine service.    Richardean Canal, MD 03/23/13 [phone]

## 2013-03-23 NOTE — ED Notes (Signed)
Patient states she is unable to give a urine sample at this time 

## 2013-03-23 NOTE — ED Notes (Signed)
Spoke with phlebotomy re: I stat troponin, blood to be drawn now

## 2013-03-24 LAB — BASIC METABOLIC PANEL
BUN: 6 mg/dL (ref 6–23)
BUN: 6 mg/dL (ref 6–23)
CO2: 26 mEq/L (ref 19–32)
CO2: 22 mEq/L (ref 19–32)
Calcium: 9 mg/dL (ref 8.4–10.5)
Calcium: 9.4 mg/dL (ref 8.4–10.5)
Chloride: 107 mEq/L (ref 96–112)
Chloride: 111 mEq/L (ref 96–112)
Creatinine, Ser: 0.71 mg/dL (ref 0.50–1.10)
Creatinine, Ser: 0.75 mg/dL (ref 0.50–1.10)
GFR calc Af Amer: >90 mL/min (ref >90)
Sodium: 137 mEq/L (ref 135–145)

## 2013-03-24 LAB — RAPID URINE DRUG SCREEN, HOSP PERFORMED
Amphetamines: NOT DETECTED
Benzodiazepines: NOT DETECTED
Cocaine: NOT DETECTED
Opiates: NOT DETECTED
Tetrahydrocannabinol: NOT DETECTED

## 2013-03-24 LAB — CBC
HCT: 38.3 % (ref 36.0–46.0)
Hemoglobin: 13.2 g/dL (ref 12.0–15.0)
MCH: 30.1 pg (ref 26.0–34.0)
MCHC: 34.5 g/dL (ref 30.0–36.0)
MCV: 87.2 fL (ref 78.0–100.0)
Platelets: 182 10*3/uL (ref 150–400)
RDW: 13.7 % (ref 11.5–15.5)
WBC: 3.9 10*3/uL — ABNORMAL LOW (ref 4.0–10.5)

## 2013-03-24 MED ORDER — ONDANSETRON HCL 4 MG PO TABS: 4.0000 mg | ORAL_TABLET | Freq: Four times a day (QID) | ORAL | Status: DC | PRN

## 2013-03-24 MED ORDER — DIPHENHYDRAMINE HCL 25 MG PO CAPS
25.0000 mg | ORAL_CAPSULE | Freq: Once | ORAL | Status: AC
  Administered 2013-03-24: 25 mg via ORAL
  Filled 2013-03-24: qty 1

## 2013-03-24 MED ORDER — PANTOPRAZOLE SODIUM 20 MG PO TBEC: 20.0000 mg | DELAYED_RELEASE_TABLET | Freq: Every day | ORAL | Status: DC

## 2013-03-24 MED ORDER — GI COCKTAIL ~~LOC~~
30.0000 mL | Freq: Once | ORAL | Status: AC
  Administered 2013-03-24: 30 mL via ORAL
  Filled 2013-03-24: qty 30

## 2013-03-24 MED ORDER — PANTOPRAZOLE SODIUM 20 MG PO TBEC
20.0000 mg | DELAYED_RELEASE_TABLET | Freq: Every day | ORAL | Status: DC
  Administered 2013-03-24: 20 mg via ORAL
  Filled 2013-03-24 (×3): qty 1

## 2013-03-24 MED ORDER — TRAZODONE HCL 50 MG PO TABS: 50.0000 mg | ORAL_TABLET | Freq: Every day | ORAL | Status: DC

## 2013-03-24 MED ORDER — SUMATRIPTAN SUCCINATE 25 MG PO TABS
25.0000 mg | ORAL_TABLET | ORAL | Status: DC | PRN
  Administered 2013-03-24: 25 mg via ORAL
  Filled 2013-03-24 (×2): qty 1

## 2013-03-24 MED ORDER — PANTOPRAZOLE SODIUM 20 MG PO TBEC
20.0000 mg | DELAYED_RELEASE_TABLET | Freq: Two times a day (BID) | ORAL | Status: DC
  Filled 2013-03-24 (×2): qty 1

## 2013-03-24 NOTE — Progress Notes (Signed)
Patient complain of stabbing chest pain left side lasting a few seconds.  Vitals stable, BP 128/81 HR 72.  Dr. Gayla Doss notified.  OK to discharge home.

## 2013-03-24 NOTE — Discharge Summary (Signed)
Family Medicine Teaching [facility] Discharge Summary  Patient name: Julie Hensley Medical record number: [id number] Date of birth: 07-Jun-1960 Age: 52 y.o. Gender: female Date of Admission: 03/23/2013  Date of Discharge: 03/24/13 Admitting Physician: Carney Living, MD  Primary Care Provider: No primary provider on file. Consultants: None  Indication for Hospitalization: Abdominal pain  Discharge Diagnoses/Problem List:  1. Gastritis 2. Hypokalemia 3. Hypercalcemia  4. Hx alcohol abuse 5. Anxiety 6. HTN  Disposition: Home  Discharge Condition: Stable  Brief Hospital Course: [name] is a 52 y.o. female who presented with nausea, vomiting and epigastric abdominal pain initially concerned for PUD due to reported "black" emesis, and PMH significant for alcohol abuse, reported diagnosis of Peptic ulcer disease. Gastroccult was negative, and her Hgb was 13.2. Her pain and nausea were treated and her diet advanced slowly. A PPI was initiated, switched to PO once able to tolerate foods, and continued at discharge. She was found initial to be hypercalcemic, but PTH and TSH within normal limits and Vit D was low. Her calcium returned to normal prior to discharge. She admitted to taking Goodie's HA powder daily, and was advice to limit its use to 2 or less days a week  Issues for Follow Up:  1. Reassess possible Migraine vs Medication overuse Headache: Given Imitrex at discharge if has migraine while coming off NSAIDs 2. Discuss depression, anxiety and alcohol abuse: adjust medical management to limit alcohol use 3. May due trial off PPI once NSAIDs and Alcohol stopped 4. Consider H Pylori testing if abdominal pain un responsive to trial of PPI therapy  Significant Procedures: None  Significant Labs and Imaging:   Recent Labs Lab 03/23/13 1139 03/24/13 0630  WBC 9.1 3.9*  HGB 17.1* 13.2  HCT 47.1* 38.3  PLT 263 182    Recent Labs Lab 03/23/13 1139 03/24/13 0014  03/24/13 0630  NA 140 137 141  K 2.4* 3.1* 3.5  CL 97 107 111  CO2 28 26 22   GLUCOSE 81 97 79  BUN 9 6 6   CREATININE 0.86 0.71 0.75  CALCIUM 11.6* 9.0 9.4  ALKPHOS 95  --   --   AST 22  --   --   ALT 15  --   --   ALBUMIN 4.6  --   --    Results/Tests Pending at Time of Discharge: None  Discharge Medications:    Medication List    STOP taking these medications       BUFFERED C POWDER PO     IBUPROFEN PM PO      TAKE these medications       ALPRAZolam 0.5 MG tablet  Commonly known as:  XANAX  Take 0.5 mg by mouth 3 (three) times daily as needed for anxiety.     amLODipine 10 MG tablet  Commonly known as:  NORVASC  Take 10 mg by mouth daily.     hydrochlorothiazide 25 MG tablet  Commonly known as:  HYDRODIURIL  Take 25 mg by mouth daily.     ondansetron 4 MG tablet  Commonly known as:  ZOFRAN  Take 1 tablet (4 mg total) by mouth every 6 (six) hours as needed for nausea.     pantoprazole 20 MG tablet  Commonly known as:  PROTONIX  Take 1 tablet (20 mg total) by mouth daily.     traZODone 50 MG tablet  Commonly known as:  DESYREL  Take 1 tablet (50 mg total) by mouth at bedtime.  Discharge Instructions: Please refer to Patient Instructions section of EMR for full details.  Patient was counseled important signs and symptoms that should prompt return to medical care, changes in medications, dietary instructions, activity restrictions, and follow up appointments.   Follow-Up Appointments:   Wenda Low, MD 03/25/2013, 5:13 PM PGY-1, [facility]

## 2013-03-24 NOTE — Progress Notes (Signed)
Family Medicine Teaching Service Attending Note  I interviewed and examined patient Julie Hensley and reviewed their tests and x-rays.  I discussed with Dr. Gayla Doss and reviewed their note for today.  I agree with their assessment and plan.     Additionally  See HP note today

## 2013-03-24 NOTE — Progress Notes (Signed)
Family Medicine Teaching Service Daily Progress Note Intern Pager: [phone]  Patient name: Julie Hensley Medical record number: [id number] Date of birth: 08/29/1960 Age: 52 y.o. Gender: female  Primary Care Provider: No primary provider on file. Consultants: None  Code Status: Full  Pt Overview and Major Events to Date: 11/22:  NPO; IV protonix; Tele; hypokalemia 11/23: hypokalemia resolved; d/c tele; PO protonix    Assessment and Plan: Julie Hensley is a 52 y.o. female presenting with nausea, vomiting and epigastric abdominal pain. PMH is significant for alcohol abuse, reported diagnosis of Peptic ulcer disease   # Nausea/Vomiting/Abdominal pain: with patient reported "black" emesis. Gatroccult was negative and hemoglobin was elevated (although possibly falsely high due to dehydration) which is reassuring and makes upper GI bleed less likely.  Differential includes PUD vs alcoholic gastritis vs hiatal hernia. No h/o diabetes making gastroparesis less likely. Normal LFT's and normal WBC making gallstone etiology less likely as well. With the description of improvement of symptoms with sitting up, hiatal hernia seems possible  - Switched protonix to PO   - zofran for nausea  - Advance diet as tolerated - repeat CBC:  Hg 13.2 and WBC 3.9: most likely dilutional - encouraged discontinuation of goodie powder which patient takes a lot of for her headache   # Hypokalemia: Resolved; likely secondary to emesis. No QT prolongation or obvious U wave on EKG - K 3.5 on 11/23 - repeat EKG: Bradycardia w/ Twave inversion leads 2,3, aVF unchanged from previous  # Hypercalcemia: Resolved; unclear etiology  - Ca 9.4 on 11/23 - TSH, PTH, vitamin D: pending   # Alcohol abuse: reportedly, patient has not been drinking recently since onset of her GI symptoms. It is unclear as to whether she would like to quit.  - CIWA protocol with ativan prn  - thiamine, multivitamin, folate  - social work consult  for resources for quitting if patient interested  - Check B12  # Anxiety: on xanax at home  - ativan per CIWA protocol  - holding home xanax prn  - trazadone started in hospital for sleep at patient's request   # HTN: normotensive in the 100's systolic  - hold home BP meds: amlodipine and HCTZ for now   FEN/GI: Full liquid diet advance as tolerated, KVO Prophylaxis: heparin   Disposition: Patient and her mother updated at bedside  Subjective: She reports HA and abdominal pain that is improving. Denies N/V and is ready to try food.   Objective: Temp:  [97.8 F (36.6 C)-99 F (37.2 C)] 98.4 F (36.9 C) (11/23 0550) Pulse Rate:  [66-106] 66 (11/23 0550) Resp:  [12-21] 20 (11/23 0550) BP: (94-152)/(68-106) 102/68 mmHg (11/23 0550) SpO2:  [94 %-100 %] 94 % (11/23 0550) Weight:  [146 lb (66.225 kg)-155 lb 8 oz (70.534 kg)] 155 lb 8 oz (70.534 kg) (11/22 1720) Physical Exam: General: No acute distress, conversant white middle-aged female.  HEENT: MMM, PERRLA Cardiovascular: S1-S2, regular rate and rhythm, no murmur appreciated  Respiratory: Clear to auscultation bilaterally  Abdomen: Soft, nondistended, bowel sounds present, mild tenderness along the epigastric region, negative Murphy's, no rebound, no guarding  Laboratory:  Recent Labs Lab 03/23/13 1139 03/24/13 0630  WBC 9.1 3.9*  HGB 17.1* 13.2  HCT 47.1* 38.3  PLT 263 182    Recent Labs Lab 03/23/13 1139 03/24/13 0014 03/24/13 0630  NA 140 137 141  K 2.4* 3.1* 3.5  CL 97 107 111  CO2 28 26 22   BUN 9 6 6  CREATININE 0.86 0.71 0.75  CALCIUM 11.6* 9.0 9.4  PROT 8.3  --   --   BILITOT 0.7  --   --   ALKPHOS 95  --   --   ALT 15  --   --   AST 22  --   --   GLUCOSE 81 97 79   Imaging/Diagnostic Tests: Acute abdominal series: 03/23/13  FINDINGS:  There is no evidence of dilated bowel loops or free intraperitoneal  air. No radiopaque calculi or other significant radiographic  abnormality is seen. Heart size  and mediastinal contours are within  normal limits. Both lungs are clear.  IMPRESSION:  Negative abdominal radiographs. No acute cardiopulmonary disease.   Wenda Low, MD 03/24/2013, 8:30 AM PGY-1, [facility] Family Medicine FPTS Intern pager: [phone], text pages welcome

## 2013-03-24 NOTE — Progress Notes (Signed)
Discussed discharge instructions and medications with patient.  Patient given dose of benadryl & Imitrex per MD order.  Patient daughter at bedside.  All questions answered.  Home mediations returned to patient.

## 2013-03-24 NOTE — Progress Notes (Signed)
   CARE MANAGEMENT NOTE 03/24/2013  Patient:  Julie Hensley,Julie Hensley   Account Number:  [id number]  Date Initiated:  03/24/2013  Documentation initiated by:  [facility]  Subjective/Objective Assessment:   adm: nausea, vomiting and epigastric abdominal pain     Action/Plan:   discharge planning   Anticipated DC Date:  03/25/2013   Anticipated DC Plan:  HOME/SELF CARE      DC Planning Services  CM consult      Choice offered to / List presented to:             Status of service:  In process, will continue to follow Medicare Important Message given?   (If response is "NO", the following Medicare IM given date fields will be blank) Date Medicare IM given:   Date Additional Medicare IM given:    Discharge Disposition:    Per UR Regulation:    If discussed at Long Length of Stay Meetings, dates discussed:    Comments:  03/24/13 09:15 CM met with pt in room and gave [facility] handout to schedule a follow up appt and to pursue PCP.  CM also gave pt handout for Legal Aide of  [facility]) to call intake number and get appt for navigator to sign up for insurance under Affordable Care Act enrollment period. Pt understands this is a free service and a navigator will help her secure insurance.  Pt may also need MATCH for discharge med needs and will CM will continue to follow. Freddy Jaksch, BSN, CM [phone].

## 2013-03-24 NOTE — H&P (Signed)
Family Medicine Teaching Service Attending Note  I interviewed and examined patient Julie Hensley and reviewed their tests and x-rays.  I discussed with Dr. Gwenlyn Saran and reviewed their note for today.  I agree with their assessment and plan.     Additionally  Feeling better no nausea or vomiting this am Does have headache - has frequently Insists no alcohol for 1 month Alert no acute distress  Most consistent with gastritis without red flags for stricture severe bleeding or cancer  Start oral ppi No alcohol - urged returning to AA Follow up with a PCP

## 2013-03-26 NOTE — Discharge Summary (Signed)
I have reviewed this discharge summary and agree.    

## 2014-02-07 ENCOUNTER — Emergency Department (HOSPITAL_COMMUNITY): Payer: Self-pay

## 2014-02-07 ENCOUNTER — Emergency Department (HOSPITAL_COMMUNITY)
Admission: EM | Admit: 2014-02-07 | Discharge: 2014-02-07 | Disposition: A | Discharge status: Home / Self Care | Payer: Self-pay | Attending: Emergency Medicine | Admitting: Emergency Medicine

## 2014-02-07 ENCOUNTER — Encounter (HOSPITAL_COMMUNITY): Payer: Self-pay | Admitting: Emergency Medicine

## 2014-02-07 DIAGNOSIS — Z720 Tobacco use: Secondary | ICD-10-CM | POA: Insufficient documentation

## 2014-02-07 DIAGNOSIS — K5900 Constipation, unspecified: Secondary | ICD-10-CM | POA: Insufficient documentation

## 2014-02-07 DIAGNOSIS — M25512 Pain in left shoulder: Secondary | ICD-10-CM | POA: Insufficient documentation

## 2014-02-07 DIAGNOSIS — Z79899 Other long term (current) drug therapy: Secondary | ICD-10-CM | POA: Insufficient documentation

## 2014-02-07 DIAGNOSIS — R079 Chest pain, unspecified: Secondary | ICD-10-CM | POA: Insufficient documentation

## 2014-02-07 DIAGNOSIS — M25511 Pain in right shoulder: Secondary | ICD-10-CM | POA: Insufficient documentation

## 2014-02-07 DIAGNOSIS — I10 Essential (primary) hypertension: Secondary | ICD-10-CM | POA: Insufficient documentation

## 2014-02-07 DIAGNOSIS — Z9049 Acquired absence of other specified parts of digestive tract: Secondary | ICD-10-CM | POA: Insufficient documentation

## 2014-02-07 DIAGNOSIS — M1990 Unspecified osteoarthritis, unspecified site: Secondary | ICD-10-CM | POA: Insufficient documentation

## 2014-02-07 DIAGNOSIS — R112 Nausea with vomiting, unspecified: Secondary | ICD-10-CM | POA: Insufficient documentation

## 2014-02-07 HISTORY — DX: Unspecified osteoarthritis, unspecified site: M19.90

## 2014-02-07 LAB — CBC
HEMATOCRIT: 44.1 % (ref 36.0–46.0)
Hemoglobin: 15.2 g/dL — ABNORMAL HIGH (ref 12.0–15.0)
MCH: 30.6 pg (ref 26.0–34.0)
MCHC: 34.5 g/dL (ref 30.0–36.0)
MCV: 88.7 fL (ref 78.0–100.0)
PLATELETS: 193 10*3/uL (ref 150–400)
RBC: 4.97 MIL/uL (ref 3.87–5.11)
RDW: 12.9 % (ref 11.5–15.5)
WBC: 5.8 10*3/uL (ref 4.0–10.5)

## 2014-02-07 LAB — BASIC METABOLIC PANEL
ANION GAP: 14 (ref 5–15)
BUN: 4 mg/dL — AB (ref 6–23)
CALCIUM: 10.3 mg/dL (ref 8.4–10.5)
CHLORIDE: 98 meq/L (ref 96–112)
CO2: 25 mEq/L (ref 19–32)
CREATININE: 0.69 mg/dL (ref 0.50–1.10)
GFR calc Af Amer: >90 mL/min (ref >90)
GFR calc non Af Amer: >90 mL/min (ref >90)
Glucose, Bld: 79 mg/dL (ref 70–99)
Potassium: 3.3 mEq/L — ABNORMAL LOW (ref 3.7–5.3)
Sodium: 137 mEq/L (ref 137–147)

## 2014-02-07 LAB — I-STAT TROPONIN, ED: Troponin i, poc: 0 ng/mL (ref 0.00–0.08)

## 2014-02-07 MED ORDER — OXYCODONE-ACETAMINOPHEN 5-325 MG PO TABS
1.0000 | ORAL_TABLET | Freq: Once | ORAL | Status: AC
  Administered 2014-02-07: 1 via ORAL
  Filled 2014-02-07: qty 1

## 2014-02-07 MED ORDER — POLYETHYLENE GLYCOL 3350 17 GM/SCOOP PO POWD: 1.0000 | Freq: Once | ORAL | Status: DC

## 2014-02-07 MED ORDER — MELOXICAM 15 MG PO TABS: 15.0000 mg | ORAL_TABLET | Freq: Every day | ORAL | Status: DC

## 2014-02-07 MED ORDER — TRAZODONE HCL 50 MG PO TABS: 50.0000 mg | ORAL_TABLET | Freq: Every day | ORAL | Status: DC

## 2014-02-07 MED ORDER — OXYCODONE-ACETAMINOPHEN 5-325 MG PO TABS: 1.0000 | ORAL_TABLET | Freq: Three times a day (TID) | ORAL | Status: DC | PRN
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
## 2014-02-07 NOTE — ED Notes (Signed)
Patient transported to X-ray 

## 2014-02-07 NOTE — Discharge Planning (Signed)
[facility] Specialist  Spoke to patient regarding primary care resources and establishing care with a provider. Resource guide and my contact information given for any future questions or concerns. No other needs identified at this time, pt being transported by xray.

## 2014-02-07 NOTE — ED Notes (Signed)
Pt reports having shoulder pain for 6 months, now is started to radiate into chest and jaw; a/w nausea
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
## 2014-02-07 NOTE — Discharge Instructions (Signed)
Shoulder Pain °The shoulder is the joint that connects your arms to your body. The bones that form the shoulder joint include the upper arm bone (humerus), the shoulder blade (scapula), and the collarbone (clavicle). The top of the humerus is shaped like a ball and fits into a rather flat socket on the scapula (glenoid cavity). A combination of muscles and strong, fibrous tissues that connect muscles to bones (tendons) support your shoulder joint and hold the ball in the socket. Small, fluid-filled sacs (bursae) are located in different areas of the joint. They act as cushions between the bones and the overlying soft tissues and help reduce friction between the gliding tendons and the bone as you move your arm. Your shoulder joint allows a wide range of motion in your arm. This range of motion allows you to do things like scratch your back or throw a ball. However, this range of motion also makes your shoulder more prone to pain from overuse and injury. °Causes of shoulder pain can originate from both injury and overuse and usually can be grouped in the following four categories: °· Redness, swelling, and pain (inflammation) of the tendon (tendinitis) or the bursae (bursitis). °· Instability, such as a dislocation of the joint. °· Inflammation of the joint (arthritis). °· Broken bone (fracture). °HOME CARE INSTRUCTIONS  °· Apply ice to the sore area. °· Put ice in a plastic bag. °· Place a towel between your skin and the bag. °· Leave the ice on for 15-20 minutes, 3-4 times per day for the first 2 days, or as directed by your health care provider. °· Stop using cold packs if they do not help with the pain. °· If you have a shoulder sling or immobilizer, wear it as long as your caregiver instructs. Only remove it to shower or bathe. Move your arm as little as possible, but keep your hand moving to prevent swelling. °· Squeeze a soft ball or foam pad as much as possible to help prevent swelling. °· Only take  over-the-counter or prescription medicines for pain, discomfort, or fever as directed by your caregiver. °SEEK MEDICAL CARE IF:  °· Your shoulder pain increases, or new pain develops in your arm, hand, or fingers. °· Your hand or fingers become cold and numb. °· Your pain is not relieved with medicines. °SEEK IMMEDIATE MEDICAL CARE IF:  °· Your arm, hand, or fingers are numb or tingling. °· Your arm, hand, or fingers are significantly swollen or turn white or blue. °MAKE SURE YOU:  °· Understand these instructions. °· Will watch your condition. °· Will get help right away if you are not doing well or get worse. °Document Released: 01/26/2005 Document Revised: 09/02/2013 Document Reviewed: 04/02/2011 °ExitCare® Patient Information ©2015 [employer]. This information is not intended to replace advice given to you by your health care provider. Make sure you discuss any questions you have with your health care provider. ° ° °Emergency Department Resource Guide °1) Find a Doctor and Pay Out of Pocket °Although you won't have to find out who is covered by your insurance plan, it is a good idea to ask around and get recommendations. You will then need to call the office and see if the doctor you have chosen will accept you as a new patient and what types of options they offer for patients who are self-pay. Some doctors offer discounts or will set up payment plans for their patients who do not have insurance, but you will need to ask so   you aren't surprised when you get to your appointment. ° °2) Contact Your Local Health Department °Not all health departments have doctors that can see patients for sick visits, but many do, so it is worth a call to see if yours does. If you don't know where your local health department is, you can check in your phone book. The CDC also has a tool to help you locate your state's health department, and many state websites also have listings of all of their local health departments. ° °3)  Find a Walk-in Clinic °If your illness is not likely to be very severe or complicated, you may want to try a walk in clinic. These are popping up all over the country in pharmacies, drugstores, and shopping centers. They're usually staffed by nurse practitioners or physician assistants that have been trained to treat common illnesses and complaints. They're usually fairly quick and inexpensive. However, if you have serious medical issues or chronic medical problems, these are probably not your best option. ° °No Primary Care Doctor: °- Call [facility] at  832-8000 - they can help you locate a primary care doctor that  accepts your insurance, provides certain services, etc. °- Physician Referral Service- 1-800-533-3463 ° °Chronic Pain Problems: °Organization         Address  Phone   Notes  °[facility]  (336) 297-2271 Patients need to be referred by their primary care doctor.  ° °Medication Assistance: °Organization         Address  Phone   Notes  °[place] [facility] [address] °[place], [place] [address] (336) 641-8030 --Must be a resident of [place] °-- Must have NO insurance coverage whatsoever (no Medicaid/ Medicare, etc.) °-- The pt. MUST have a primary care doctor that directs their care regularly and follows them in the community °  °[facility]  (866) 331-1348   °[employer]  (888) 892-1162   ° °Agencies that provide inexpensive medical care: °Organization         Address  Phone   Notes  °[facility]  (336) 832-8035   °[facility]    (336) 832-7272   °[facility] [address] °[place], [place] [address] (336) 832-4777   °[facility] [address], °[place] (336) 271-4999   °[facility]    (336) 373-0678   °[facility]    (336) 272-1050   °[facility] ° [address], [place] Phone:  (336) 832-4444, Fax:  (336)  832-4440 Hours of Operation:  9 am - 6 pm, M-F.  Also accepts Medicaid/Medicare and self-pay.  °[facility] ° [address], [place] Phone: (336) 832-3150, Fax: (336) 832-3151. Hours of Operation:  8:30 am - 5:30 pm, M-F.  Also accepts Medicaid and self-pay.  °[facility] [place] [address], [place] Phone: (336) 878-6027   °[facility] [address], [place], [place] (336)723-1848, Ext. 123 Mondays & Thursdays: 7-9 AM.  First 15 patients are seen on a first come, first serve basis. °  ° °Medicaid-accepting [place] Providers: ° °Organization         Address  Phone   Notes  °[facility] [address], [place] (336) 641-2100 Also accepts self-pay patients.  °[facility] [address],  ° (336) 856-9996   °  [facility] [address], [place] (336) 288-8857   °[facility] [address] [place] [address], [place] (336) 299-7000   °Veita Bland [address], [place]  ° (336) 373-1557 Only accepts [place] Access Medicaid patients after they have their name applied to their card.  ° °Self-Pay (no insurance) in [place]: ° °Organization         Address  Phone   Notes  °Sickle Cell Patients, [facility] [address], [place] (336) 832-1970   °[facility] [address], [place] (336) 832-4400   °[facility] ° [address], [place] (336) 992-4800   °[facility]/Dr. Osei-Bonsu ° [address] [place] [address], [place] or [address], [place] (336) 841-8500 Phone number for both [place] and [place] locations is the same.  °[facility] [address], [place] (336) 299-0000   °[facility] [address] [place] [address], [place] or [address] (336) 852-7530 °(336) 878-2260     °[facility] [address], [place] (336) 350-1642, phone; (336) 294-5005, fax Sees patients 1st and 3rd Saturday of every month.  Must not qualify for public or private insurance (i.e. Medicaid, Medicare, [place] Health Choice, Veterans' Benefits) • Household income should be no more than 200% of the poverty level •The clinic cannot treat you if you are pregnant or think you are pregnant • Sexually transmitted diseases are not treated at the clinic.  ° ° °Dental Care: °Organization         Address  Phone  Notes  °[facility] [address], [place] (336) 641-6152 Accepts children up to age 21 who are enrolled in Medicaid or [place] Health Choice; pregnant women with a Medicaid card; and children who have applied for Medicaid or [place] Health Choice, but were declined, whose parents can pay a reduced fee at time of service.  °[facility] [place]  [address], [place] (336) 641-7733 Accepts children up to age 21 who are enrolled in Medicaid or [place] Health Choice; pregnant women with a Medicaid card; and children who have applied for Medicaid or [place] Health Choice, but were declined, whose parents can pay a reduced fee at time of service.  °[facility] ° [address],  (336) 641-4533 Patients are seen by appointment only. Walk-ins are not accepted. [facility] will see patients 18 years of age and older. °Monday - Tuesday (8am-5pm) °Most Wednesdays (8:30-5pm) °$30 per visit, cash only  °[facility] ° [address], [place] (336) 641-4533 Patients are seen by appointment only. Walk-ins are not accepted. [facility] will see patients 18 years of age and older. °One Wednesday Evening (Monthly: Volunteer Based).  $30 per visit, cash only  °[facility]  (919) 537-3737 for adults; Children under age 4, call  [facility] at ([phone]) [phone]. Children aged 4-14, please call (919) [phone] to request a pediatric application. ° Dental services are provided in all areas of dental care including fillings, crowns and bridges, complete and partial dentures, implants, gum treatment, root canals, and extractions. Preventive care is also provided. Treatment is provided to both adults and children. °Patients are selected via a lottery and there is often a waiting   list. °  °[facility] [address], °[place] ° (336) 763-8833 [email or web address] °  °[facility] [address], [place], [place] (336)723-1848, Ext. 123 Second and Fourth Thursday of each month, opens at 6:30 AM; Clinic ends at 9 AM.  Patients are seen on a first-come first-served basis, and a limited number are seen during each clinic.  ° °[facility] ° [address], [place], [place] (336) 723-7904   Eligibility Requirements °You must have lived in [place], or [place] counties for at least the last three months. °  You cannot be eligible for state or federal sponsored healthcare insurance, including Veterans Administration, Medicaid, or Medicare. °  You generally cannot be eligible for healthcare insurance through your employer.  °  How to apply: °Eligibility screenings are held every Tuesday and Wednesday afternoon from 1:00 pm until 4:00 pm. You do not need an appointment for the interview!  °[facility] [address], [place], [place] 336-631-2330   °[facility]  336-342-8273   °[facility]  336-703-3100   °[facility]  336-570-6415   ° °Behavioral Health Resources in the Community: °Intensive Outpatient Programs °Organization         Address  Phone  Notes  °[facility] [address], [place], [place] 336-878-6098   °[facility] [address], [place], [place]  336-832-9800   °[facility] [address], [place], [place] ° 336-882-2125   °[facility] [address],  °[place], [place] 1-800-853-5163 or 336-641-4981   °Substance Abuse Resources °Organization         Address  Phone  Notes  °[facility]  336-882-2125   °[facility]  336-784-9470   °[facility]  336-285-9073   °[facility]  336-845-3988   °Residential & Outpatient Substance Abuse Program  1-800-659-3381   °Psychological Services °Organization         Address  Phone  Notes  °[facility]  336- 832-9600   °[facility]  336- 378-7881   °[facility] [address], [place] 1-800-853-5163 or 336-641-4981   ° °Mobile Crisis Teams °Organization         Address  Phone  Notes  °[facility], Mobile Crisis Care Unit  1-877-626-1772   °Assertive °[facility] ° [address] [place], [place] 336-834-9664   °Sharon DeEsch [address] °[place] 336-554-5454   ° °Self-Help/Support Groups °Organization         Address  Phone             Notes  °[facility] [place] - variety of support groups  336- 373-1402 Call for more information  °Narcotics Anonymous (NA), [facility] [address], °[place]  2 meetings at this location  ° °Residential Treatment Programs °Organization         Address  Phone  Notes  °[facility] [address],    °[place]  1-866-801-8205   °[facility] ° [address], [place], [place] 704-293-8524   °[facility] [address], [place] 336-845-3988 Admissions: 8am-3pm M-F  °[facility] [address]    °[place], [place] 336-841-1104   °[facility] [address], [place], [place] 336-379-7146   °[facility]   [address]  °[place], [place] 336-285-9073   °[facility] - Intensive Outpatient [address], [place], [place] 336-852-3033   °[facility] [address]  °[place], [place] 1-877-615-2722 or 336-784-9470   °[facility] [address] [place], [place] 336-227-7417 Accepts Medicaid  °[facility] [address]  °[place] 1-800-659-3381 Substance Abuse/Addiction Treatment  ° °[place] Behavioral Health Resources °Organization         Address  Phone  Notes  °[facility]  (888) 581-9988   °Julie Brannon, PhD [address] [place], [place]   (336) 349-5553 or (336) 951-0000   ° Behavioral   [address] °[place], [place] (336) 349-4454   °[facility] [address], [place], [place] (336) 342-8316 Insurance/Medicaid/sponsorship through [facility]  °[facility] [address]                                    [place], [place] (336) 342-8316 Therapy/tele-psych/case  °[facility] [address]  ° [place], [place] (336) 349-2233    °Dr. Arfeen  (336) 349-4544   °[facility] [place]  [employer] [place] [facility] 1) [address], [place] °2) [address], [place] °3)  [address], [place] (336) 349-3220 °(336) 342-7768 ° °(336) 342-8140   °[place] [facility] (336) 342-1394 or (336) 342-3537 (After Hours)    ° ° ° °

## 2014-02-07 NOTE — ED Notes (Signed)
Unsuccessful IV attempt x2.  Per Loma Sousa, PA-C ok to wait on IV for now

## 2014-02-15 NOTE — ED Provider Notes (Signed)
Medical screening examination/treatment/procedure(s) were performed by non-physician practitioner and as supervising physician I was immediately available for consultation/collaboration.   EKG Interpretation   Date/Time:  Friday February 07 2014 09:37:20 EDT Ventricular Rate:  94 PR Interval:  142 QRS Duration: 82 QT Interval:  286 QTC Calculation: 357 R Axis:   74 Text Interpretation:  Normal sinus rhythm Low voltage QRS Since last  tracing 24 Mar 2013 ST \\T \ T wave abnormality, consider inferior ischemia  and lateral ischemia Confirmed by KNAPP  MD-I, IVA ([id number]) on 02/07/2014  9:39:34 AM       Babette Relic, MD 02/15/14 2224

## 2014-03-03 ENCOUNTER — Encounter (HOSPITAL_COMMUNITY): Payer: Self-pay | Admitting: Emergency Medicine

## 2014-06-10 ENCOUNTER — Encounter: Payer: Self-pay | Admitting: Internal Medicine

## 2014-06-10 ENCOUNTER — Ambulatory Visit: Payer: Self-pay | Attending: Internal Medicine | Admitting: Internal Medicine

## 2014-06-10 VITALS — BP 150/80 | HR 102 | Temp 97.9°F | Resp 17 | Wt 128.6 lb

## 2014-06-10 DIAGNOSIS — F17200 Nicotine dependence, unspecified, uncomplicated: Secondary | ICD-10-CM

## 2014-06-10 DIAGNOSIS — G4700 Insomnia, unspecified: Secondary | ICD-10-CM

## 2014-06-10 DIAGNOSIS — I10 Essential (primary) hypertension: Secondary | ICD-10-CM

## 2014-06-10 DIAGNOSIS — Z791 Long term (current) use of non-steroidal anti-inflammatories (NSAID): Secondary | ICD-10-CM | POA: Insufficient documentation

## 2014-06-10 DIAGNOSIS — K219 Gastro-esophageal reflux disease without esophagitis: Secondary | ICD-10-CM

## 2014-06-10 DIAGNOSIS — M25511 Pain in right shoulder: Secondary | ICD-10-CM

## 2014-06-10 DIAGNOSIS — Z79899 Other long term (current) drug therapy: Secondary | ICD-10-CM | POA: Insufficient documentation

## 2014-06-10 DIAGNOSIS — M25549 Pain in joints of unspecified hand: Secondary | ICD-10-CM | POA: Insufficient documentation

## 2014-06-10 DIAGNOSIS — M25512 Pain in left shoulder: Secondary | ICD-10-CM

## 2014-06-10 DIAGNOSIS — F419 Anxiety disorder, unspecified: Secondary | ICD-10-CM | POA: Insufficient documentation

## 2014-06-10 DIAGNOSIS — Z720 Tobacco use: Secondary | ICD-10-CM

## 2014-06-10 DIAGNOSIS — K5900 Constipation, unspecified: Secondary | ICD-10-CM

## 2014-06-10 DIAGNOSIS — F17210 Nicotine dependence, cigarettes, uncomplicated: Secondary | ICD-10-CM | POA: Insufficient documentation

## 2014-06-10 DIAGNOSIS — M79646 Pain in unspecified finger(s): Secondary | ICD-10-CM

## 2014-06-10 DIAGNOSIS — F411 Generalized anxiety disorder: Secondary | ICD-10-CM

## 2014-06-10 LAB — CBC WITH DIFFERENTIAL/PLATELET
Basophils Absolute: 0.1 10*3/uL (ref 0.0–0.1)
Basophils Relative: 1 % (ref 0–1)
EOS ABS: 0.2 10*3/uL (ref 0.0–0.7)
Eosinophils Relative: 3 % (ref 0–5)
HCT: 40.9 % (ref 36.0–46.0)
Hemoglobin: 13.8 g/dL (ref 12.0–15.0)
LYMPHS ABS: 1.9 10*3/uL (ref 0.7–4.0)
Lymphocytes Relative: 33 % (ref 12–46)
MCH: 30.1 pg (ref 26.0–34.0)
MCHC: 33.7 g/dL (ref 30.0–36.0)
MCV: 89.3 fL (ref 78.0–100.0)
MPV: 10 fL (ref 8.6–12.4)
Monocytes Absolute: 0.4 10*3/uL (ref 0.1–1.0)
Monocytes Relative: 7 % (ref 3–12)
NEUTROS ABS: 3.3 10*3/uL (ref 1.7–7.7)
NEUTROS PCT: 56 % (ref 43–77)
Platelets: 236 10*3/uL (ref 150–400)
RBC: 4.58 MIL/uL (ref 3.87–5.11)
RDW: 13.8 % (ref 11.5–15.5)
WBC: 5.9 10*3/uL (ref 4.0–10.5)

## 2014-06-10 MED ORDER — BUPROPION HCL ER (XL) 150 MG PO TB24: 150.0000 mg | ORAL_TABLET | Freq: Every day | ORAL | Status: DC

## 2014-06-10 MED ORDER — MAGNESIUM HYDROXIDE 400 MG/5ML PO SUSP: 30.0000 mL | Freq: Every day | ORAL | Status: DC | PRN

## 2014-06-10 MED ORDER — OMEPRAZOLE 40 MG PO CPDR: 40.0000 mg | DELAYED_RELEASE_CAPSULE | Freq: Every day | ORAL | Status: DC

## 2014-06-10 NOTE — Patient Instructions (Signed)
Smoking Cessation Quitting smoking is important to your health and has many advantages. However, it is not always easy to quit since nicotine is a very addictive drug. Oftentimes, people try 3 times or more before being able to quit. This document explains the best ways for you to prepare to quit smoking. Quitting takes hard work and a lot of effort, but you can do it. ADVANTAGES OF QUITTING SMOKING  You will live longer, feel better, and live better.  Your body will feel the impact of quitting smoking almost immediately.  Within 20 minutes, blood pressure decreases. Your pulse returns to its normal level.  After 8 hours, carbon monoxide levels in the blood return to normal. Your oxygen level increases.  After 24 hours, the chance of having a heart attack starts to decrease. Your breath, hair, and body stop smelling like smoke.  After 48 hours, damaged nerve endings begin to recover. Your sense of taste and smell improve.  After 72 hours, the body is virtually free of nicotine. Your bronchial tubes relax and breathing becomes easier.  After 2 to 12 weeks, lungs can hold more air. Exercise becomes easier and circulation improves.  The risk of having a heart attack, stroke, cancer, or lung disease is greatly reduced.  After 1 year, the risk of coronary heart disease is cut in half.  After 5 years, the risk of stroke falls to the same as a nonsmoker.  After 10 years, the risk of lung cancer is cut in half and the risk of other cancers decreases significantly.  After 15 years, the risk of coronary heart disease drops, usually to the level of a nonsmoker.  If you are pregnant, quitting smoking will improve your chances of having a healthy baby.  The people you live with, especially any children, will be healthier.  You will have extra money to spend on things other than cigarettes. QUESTIONS TO THINK ABOUT BEFORE ATTEMPTING TO QUIT You may want to talk about your answers with your  health care provider.  Why do you want to quit?  If you tried to quit in the past, what helped and what did not?  What will be the most difficult situations for you after you quit? How will you plan to handle them?  Who can help you through the tough times? Your family? Friends? A health care provider?  What pleasures do you get from smoking? What ways can you still get pleasure if you quit? Here are some questions to ask your health care provider:  How can you help me to be successful at quitting?  What medicine do you think would be best for me and how should I take it?  What should I do if I need more help?  What is smoking withdrawal like? How can I get information on withdrawal? GET READY  Set a quit date.  Change your environment by getting rid of all cigarettes, ashtrays, matches, and lighters in your home, car, or work. Do not let people smoke in your home.  Review your past attempts to quit. Think about what worked and what did not. GET SUPPORT AND ENCOURAGEMENT You have a better chance of being successful if you have help. You can get support in many ways.  Tell your family, friends, and coworkers that you are going to quit and need their support. Ask them not to smoke around you.  Get individual, group, or telephone counseling and support. Programs are available at local hospitals and health centers. Call   your local health department for information about programs in your area.  Spiritual beliefs and practices may help some smokers quit.  Download a "quit meter" on your computer to keep track of quit statistics, such as how long you have gone without smoking, cigarettes not smoked, and money saved.  Get a self-help book about quitting smoking and staying off tobacco. LEARN NEW SKILLS AND BEHAVIORS  Distract yourself from urges to smoke. Talk to someone, go for a walk, or occupy your time with a task.  Change your normal routine. Take a different route to work.  Drink tea instead of coffee. Eat breakfast in a different place.  Reduce your stress. Take a hot bath, exercise, or read a book.  Plan something enjoyable to do every day. Reward yourself for not smoking.  Explore interactive web-based programs that specialize in helping you quit. GET MEDICINE AND USE IT CORRECTLY Medicines can help you stop smoking and decrease the urge to smoke. Combining medicine with the above behavioral methods and support can greatly increase your chances of successfully quitting smoking.  Nicotine replacement therapy helps deliver nicotine to your body without the negative effects and risks of smoking. Nicotine replacement therapy includes nicotine gum, lozenges, inhalers, nasal sprays, and skin patches. Some may be available over-the-counter and others require a prescription.  Antidepressant medicine helps people abstain from smoking, but how this works is unknown. This medicine is available by prescription.  Nicotinic receptor partial agonist medicine simulates the effect of nicotine in your brain. This medicine is available by prescription. Ask your health care provider for advice about which medicines to use and how to use them based on your health history. Your health care provider will tell you what side effects to look out for if you choose to be on a medicine or therapy. Carefully read the information on the package. Do not use any other product containing nicotine while using a nicotine replacement product.  RELAPSE OR DIFFICULT SITUATIONS Most relapses occur within the first 3 months after quitting. Do not be discouraged if you start smoking again. Remember, most people try several times before finally quitting. You may have symptoms of withdrawal because your body is used to nicotine. You may crave cigarettes, be irritable, feel very hungry, cough often, get headaches, or have difficulty concentrating. The withdrawal symptoms are only temporary. They are strongest  when you first quit, but they will go away within 10-14 days. To reduce the chances of relapse, try to:  Avoid drinking alcohol. Drinking lowers your chances of successfully quitting.  Reduce the amount of caffeine you consume. Once you quit smoking, the amount of caffeine in your body increases and can give you symptoms, such as a rapid heartbeat, sweating, and anxiety.  Avoid smokers because they can make you want to smoke.  Do not let weight gain distract you. Many smokers will gain weight when they quit, usually less than 10 pounds. Eat a healthy diet and stay active. You can always lose the weight gained after you quit.  Find ways to improve your mood other than smoking. FOR MORE INFORMATION  www.smokefree.gov  Document Released: 04/12/2001 Document Revised: 09/02/2013 Document Reviewed: 07/28/2011 ExitCare Patient Information 2015 [employer]. This information is not intended to replace advice given to you by your health care provider. Make sure you discuss any questions you have with your health care provider. DASH Eating Plan DASH stands for "Dietary Approaches to Stop Hypertension." The DASH eating plan is a healthy eating plan that has   been shown to reduce high blood pressure (hypertension). Additional health benefits may include reducing the risk of type 2 diabetes mellitus, heart disease, and stroke. The DASH eating plan may also help with weight loss. WHAT DO I NEED TO KNOW ABOUT THE DASH EATING PLAN? For the DASH eating plan, you will follow these general guidelines:  Choose foods with a percent daily value for sodium of less than 5% (as listed on the food label).  Use salt-free seasonings or herbs instead of table salt or sea salt.  Check with your health care provider or pharmacist before using salt substitutes.  Eat lower-sodium products, often labeled as "lower sodium" or "no salt added."  Eat fresh foods.  Eat more vegetables, fruits, and low-fat dairy  products.  Choose whole grains. Look for the word "whole" as the first word in the ingredient list.  Choose fish and skinless chicken or turkey more often than red meat. Limit fish, poultry, and meat to 6 oz (170 g) each day.  Limit sweets, desserts, sugars, and sugary drinks.  Choose heart-healthy fats.  Limit cheese to 1 oz (28 g) per day.  Eat more home-cooked food and less restaurant, buffet, and fast food.  Limit fried foods.  Cook foods using methods other than frying.  Limit canned vegetables. If you do use them, rinse them well to decrease the sodium.  When eating at a restaurant, ask that your food be prepared with less salt, or no salt if possible. WHAT FOODS CAN I EAT? Seek help from a dietitian for individual calorie needs. Grains Whole grain or whole wheat bread. Brown rice. Whole grain or whole wheat pasta. Quinoa, bulgur, and whole grain cereals. Low-sodium cereals. Corn or whole wheat flour tortillas. Whole grain cornbread. Whole grain crackers. Low-sodium crackers. Vegetables Fresh or frozen vegetables (raw, steamed, roasted, or grilled). Low-sodium or reduced-sodium tomato and vegetable juices. Low-sodium or reduced-sodium tomato sauce and paste. Low-sodium or reduced-sodium canned vegetables.  Fruits All fresh, canned (in natural juice), or frozen fruits. Meat and Other Protein Products Ground beef (85% or leaner), grass-fed beef, or beef trimmed of fat. Skinless chicken or turkey. Ground chicken or turkey. Pork trimmed of fat. All fish and seafood. Eggs. Dried beans, peas, or lentils. Unsalted nuts and seeds. Unsalted canned beans. Dairy Low-fat dairy products, such as skim or 1% milk, 2% or reduced-fat cheeses, low-fat ricotta or cottage cheese, or plain low-fat yogurt. Low-sodium or reduced-sodium cheeses. Fats and Oils Tub margarines without trans fats. Light or reduced-fat mayonnaise and salad dressings (reduced sodium). Avocado. Safflower, olive, or canola  oils. Natural peanut or almond butter. Other Unsalted popcorn and pretzels. The items listed above may not be a complete list of recommended foods or beverages. Contact your dietitian for more options. WHAT FOODS ARE NOT RECOMMENDED? Grains White bread. White pasta. White rice. Refined cornbread. Bagels and croissants. Crackers that contain trans fat. Vegetables Creamed or fried vegetables. Vegetables in a cheese sauce. Regular canned vegetables. Regular canned tomato sauce and paste. Regular tomato and vegetable juices. Fruits Dried fruits. Canned fruit in light or heavy syrup. Fruit juice. Meat and Other Protein Products Fatty cuts of meat. Ribs, chicken wings, bacon, sausage, bologna, salami, chitterlings, fatback, hot dogs, bratwurst, and packaged luncheon meats. Salted nuts and seeds. Canned beans with salt. Dairy Whole or 2% milk, cream, half-and-half, and cream cheese. Whole-fat or sweetened yogurt. Full-fat cheeses or blue cheese. Nondairy creamers and whipped toppings. Processed cheese, cheese spreads, or cheese curds. Condiments Onion and garlic salt,   seasoned salt, table salt, and sea salt. Canned and packaged gravies. Worcestershire sauce. Tartar sauce. Barbecue sauce. Teriyaki sauce. Soy sauce, including reduced sodium. Steak sauce. Fish sauce. Oyster sauce. Cocktail sauce. Horseradish. Ketchup and mustard. Meat flavorings and tenderizers. Bouillon cubes. Hot sauce. Tabasco sauce. Marinades. Taco seasonings. Relishes. Fats and Oils Butter, stick margarine, lard, shortening, ghee, and bacon fat. Coconut, palm kernel, or palm oils. Regular salad dressings. Other Pickles and olives. Salted popcorn and pretzels. The items listed above may not be a complete list of foods and beverages to avoid. Contact your dietitian for more information. WHERE CAN I FIND MORE INFORMATION? National Heart, Lung, and Blood Institute: www.nhlbi.nih.gov/health/health-topics/topics/dash/ Document Released:  04/07/2011 Document Revised: 09/02/2013 Document Reviewed: 02/20/2013 ExitCare Patient Information 2015 [employer]. This information is not intended to replace advice given to you by your health care provider. Make sure you discuss any questions you have with your health care provider.  

## 2014-06-10 NOTE — Progress Notes (Signed)
Patient Demographics  Julie Hensley, is a 54 y.o. female  PZW:[id number]  [id number]  DOB - November 12, 1960  CC:  Chief Complaint  Patient presents with  . Establish Care    anxiety       HPI: Julie Hensley is a 54 y.o. female here today to establish medical care.patient has history of multiple joint pain including bilateral shoulder pain, for those symptoms she also went to the emergency room last year and had x-rays done and was advised to follow with orthopedics, ?she was diagnosed with her rheumatoid arthritis in the past, she denies any family history of rheumatoid arthritis, she also history of anxiety/depression, insomnia has been taking Xanax, trazodone, she actively smokes cigarettes, I have counseled patient to quit smoking she is going to try Wellbutrin, she also history of hypertension currently on hydrochlorothiazide and Tenex as per patient she has not taken the medication today, her manual blood pressure is 150/80.patient also complaining of reflux symptoms and is requesting some medication to help her with the symptoms, complaining of constipation. Patient has No headache, No chest pain, No abdominal pain - No Nausea, No new weakness tingling or numbness, No Cough - SOB.  Allergies  Allergen Reactions  . Vicodin [Hydrocodone-Acetaminophen] Itching   Past Medical History  Diagnosis Date  . Hypertension     HBP at [employer] machine but never been to doctor for it.   . Complication of anesthesia   . PONV (postoperative nausea and vomiting)   . Arthritis   . Anxiety    Current Outpatient Prescriptions on File Prior to Visit  Medication Sig Dispense Refill  . ALPRAZolam (XANAX) 0.5 MG tablet Take 0.5 mg by mouth 3 (three) times daily as needed for anxiety.    Marland Kitchen amLODipine (NORVASC) 10 MG tablet Take 10 mg by mouth daily.    . hydrochlorothiazide (HYDRODIURIL) 25 MG tablet Take 25 mg by mouth daily.    . meloxicam (MOBIC) 15 MG tablet Take 1 tablet (15 mg total) by  mouth daily. 30 tablet 0  . ondansetron (ZOFRAN) 4 MG tablet Take 1 tablet (4 mg total) by mouth every 6 (six) hours as needed for nausea. 10 tablet 0  . oxyCODONE-acetaminophen (PERCOCET/ROXICET) 5-325 MG per tablet Take 1 tablet by mouth every 8 (eight) hours as needed for moderate pain or severe pain. 10 tablet 0  . pantoprazole (PROTONIX) 20 MG tablet Take 1 tablet (20 mg total) by mouth daily. 30 tablet 0  . polyethylene glycol powder (MIRALAX) powder Take 255 g by mouth once. 255 g 0  . traZODone (DESYREL) 50 MG tablet Take 1 tablet (50 mg total) by mouth at bedtime. 30 tablet 0  . traZODone (DESYREL) 50 MG tablet Take 1 tablet (50 mg total) by mouth at bedtime. 30 tablet 0   No current facility-administered medications on file prior to visit.   Family History  Problem Relation Age of Onset  . Anesthesia problems Neg Hx   . Hypertension Mother   . Heart disease Mother   . Hypertension Father   . Stroke Father   . Hypertension Brother   . Diabetes Paternal Grandmother    History   Social History  . Marital Status: Divorced    Spouse Name: N/A    Number of Children: N/A  . Years of Education: N/A   Occupational History  . Not on file.   Social History Main Topics  . Smoking status: Current Every Day Smoker -- 0.50 packs/day for 40 years  Types: Cigarettes  . Smokeless tobacco: Not on file  . Alcohol Use: No     Comment: has not had alcohol in a month.  Previously a case of beer daily  . Drug Use: No  . Sexual Activity: Yes    Birth Control/ Protection: Post-menopausal     Comment: sex x1 in past year 2 weeks ago   Other Topics Concern  . Not on file   Social History Narrative    Review of Systems: Constitutional: Negative for fever, chills, diaphoresis, activity change, appetite change and fatigue. HENT: Negative for ear pain, nosebleeds, congestion, facial swelling, rhinorrhea, neck pain, neck stiffness and ear discharge.  Eyes: Negative for pain, discharge,  redness, itching and visual disturbance. Respiratory: Negative for cough, choking, chest tightness, shortness of breath, wheezing and stridor.  Cardiovascular: Negative for chest pain, palpitations and leg swelling. Gastrointestinal: Negative for abdominal distention. Genitourinary: Negative for dysuria, urgency, frequency, hematuria, flank pain, decreased urine volume, difficulty urinating and dyspareunia.  Musculoskeletal: Negative for back pain, joint swelling, arthralgia and gait problem. Neurological: Negative for dizziness, tremors, seizures, syncope, facial asymmetry, speech difficulty, weakness, light-headedness, numbness and headaches.  Hematological: Negative for adenopathy. Does not bruise/bleed easily. Psychiatric/Behavioral: Negative for hallucinations, behavioral problems, confusion, dysphoric mood, decreased concentration and agitation.    Objective:   Filed Vitals:   06/10/14 0949  BP: 150/80  Pulse:   Temp:   Resp:     Physical Exam: Constitutional: Patient appears well-developed and well-nourished. No distress. HENT: Normocephalic, atraumatic, External right and left ear normal. Oropharynx is clear and moist.  Eyes: Conjunctivae and EOM are normal. PERRLA, no scleral icterus. Neck: Normal ROM. Neck supple. No JVD. No tracheal deviation. No thyromegaly. CVS: RRR, S1/S2 +, no murmurs, no gallops, no carotid bruit.  Pulmonary: Effort and breath sounds normal, no stridor, rhonchi, wheezes, rales.  Abdominal: Soft. BS +, no distension, tenderness, rebound or guarding.  Musculoskeletal: Normal range of motion. No edema and no tenderness.bilateral shoulders some limitation with full range of motion Neuro: Alert. Normal reflexes, muscle tone coordination. No cranial nerve deficit. Skin: Skin is warm and dry. No rash noted. Not diaphoretic. No erythema. No pallor. Psychiatric: Normal mood and affect. Behavior, judgment, thought content normal.  Lab Results  Component Value  Date   WBC 5.8 02/07/2014   HGB 15.2* 02/07/2014   HCT 44.1 02/07/2014   MCV 88.7 02/07/2014   PLT 193 02/07/2014   Lab Results  Component Value Date   CREATININE 0.69 02/07/2014   BUN 4* 02/07/2014   NA 137 02/07/2014   K 3.3* 02/07/2014   CL 98 02/07/2014   CO2 25 02/07/2014    No results found for: HGBA1C Lipid Panel  No results found for: CHOL, TRIG, HDL, CHOLHDL, VLDL, LDLCALC     Assessment and plan:   1. Pain in multiple finger joints  - CBC with Differential/Platelet - Rheumatoid factor - C-reactive protein - Uric acid  2. Bilateral shoulder pain  - Ambulatory referral to Orthopedic Surgery  3. Insomnia Continue with trazodone.  4. Essential hypertension Patient is currently on hydrochlorothiazide and tenex , continue with current meds ordered baseline blood work. - COMPLETE METABOLIC PANEL WITH GFR - TSH - Lipid panel - Vit D  25 hydroxy (rtn osteoporosis monitoring) - Hemoglobin A1c  5. Smoking  - buPROPion (WELLBUTRIN XL) 150 MG 24 hr tablet; Take 1 tablet (150 mg total) by mouth daily.  Dispense: 30 tablet; Refill: 3  6. Constipation, unspecified constipation type I have  advised patient to increase fiber in her diet. - magnesium hydroxide (MILK OF MAGNESIA) 400 MG/5ML suspension; Take 30 mLs by mouth daily as needed for mild constipation.  Dispense: 360 mL; Refill: 0  7. Anxiety state Currently patient is taking Xanax ear - Ambulatory referral to Psychiatry  8. Gastroesophageal reflux disease, esophagitis presence not specified Lifestyle modification, trial of Prilosec.  - omeprazole (PRILOSEC) 40 MG capsule; Take 1 capsule (40 mg total) by mouth daily.  Dispense: 30 capsule; Refill: 3    Health Maintenance  -Vaccinations:  Patient declines flu shot.  Return in about 3 months (around 09/08/2014) for hypertension.   Lorayne Marek, MD

## 2014-06-10 NOTE — Progress Notes (Signed)
Patient here to establish care Patient states she has been back and forth to [facility] For her head neck and shoulder pain Takes xanax for anxiety Has a history of HTN Has trouble sleeping so she has been on trazodone Patient declined flu vaccine

## 2014-06-11 ENCOUNTER — Telehealth: Payer: Self-pay | Admitting: *Deleted

## 2014-06-11 ENCOUNTER — Telehealth: Payer: Self-pay | Admitting: Internal Medicine

## 2014-06-11 DIAGNOSIS — M25511 Pain in right shoulder: Secondary | ICD-10-CM

## 2014-06-11 DIAGNOSIS — M546 Pain in thoracic spine: Secondary | ICD-10-CM

## 2014-06-11 DIAGNOSIS — M25512 Pain in left shoulder: Principal | ICD-10-CM

## 2014-06-11 LAB — COMPLETE METABOLIC PANEL WITH GFR
ALT: 12 U/L (ref 0–35)
AST: 16 U/L (ref 0–37)
Albumin: 4.2 g/dL (ref 3.5–5.2)
Alkaline Phosphatase: 85 U/L (ref 39–117)
BUN: 7 mg/dL (ref 6–23)
CALCIUM: 9.9 mg/dL (ref 8.4–10.5)
CHLORIDE: 103 meq/L (ref 96–112)
CO2: 28 meq/L (ref 19–32)
Creat: 0.75 mg/dL (ref 0.50–1.10)
GFR, Est Non African American: >89 mL/min
Glucose, Bld: 87 mg/dL (ref 70–99)
POTASSIUM: 4.2 meq/L (ref 3.5–5.3)
SODIUM: 140 meq/L (ref 135–145)
TOTAL PROTEIN: 6.6 g/dL (ref 6.0–8.3)
Total Bilirubin: 0.3 mg/dL (ref 0.2–1.2)

## 2014-06-11 LAB — LIPID PANEL
CHOL/HDL RATIO: 4.6 ratio
CHOLESTEROL: 201 mg/dL — AB (ref 0–200)
HDL: 44 mg/dL (ref >39)
LDL Cholesterol: 117 mg/dL — ABNORMAL HIGH (ref 0–99)
Triglycerides: 200 mg/dL — ABNORMAL HIGH (ref <150)
VLDL: 40 mg/dL (ref 0–40)

## 2014-06-11 LAB — VITAMIN D 25 HYDROXY (VIT D DEFICIENCY, FRACTURES): VIT D 25 HYDROXY: 21 ng/mL — AB (ref 30–100)

## 2014-06-11 LAB — URIC ACID: Uric Acid, Serum: 5 mg/dL (ref 2.4–7.0)

## 2014-06-11 LAB — C-REACTIVE PROTEIN: CRP: <0.5 mg/dL (ref <0.60)

## 2014-06-11 LAB — TSH: TSH: 2.106 u[IU]/mL (ref 0.350–4.500)

## 2014-06-11 LAB — HEMOGLOBIN A1C
Hgb A1c MFr Bld: 5.2 % (ref <5.7)
Mean Plasma Glucose: 103 mg/dL (ref <117)

## 2014-06-11 LAB — RHEUMATOID FACTOR: Rhuematoid fact SerPl-aCnc: 26 IU/mL — ABNORMAL HIGH (ref <=14)

## 2014-06-11 MED ORDER — VITAMIN D (ERGOCALCIFEROL) 1.25 MG (50000 UNIT) PO CAPS: 50000.0000 [IU] | ORAL_CAPSULE | ORAL | Status: DC

## 2014-06-11 NOTE — Telephone Encounter (Signed)
-----   Message from Lorayne Marek, MD sent at 06/11/2014 12:49 PM EST ----- Blood work reviewed, noticed low vitamin D, call patient advise to start ergocalciferol 50,000 units once a week for the duration of  12 weeks.  noticed elevated triglycerides, advise patient for low fat diet.  Also her rheumatoid factor test is low positive, if patient is getting lot of symptoms of joint pain/stiffness, she can be referred to rheumatology for further evaluation.

## 2014-06-11 NOTE — Telephone Encounter (Signed)
Pt aware of results. Advised to maintain low fat diet Referral order

## 2014-06-25 ENCOUNTER — Ambulatory Visit: Payer: Self-pay | Admitting: Sports Medicine

## 2014-06-25 ENCOUNTER — Ambulatory Visit: Payer: Self-pay | Attending: Internal Medicine

## 2014-07-01 ENCOUNTER — Encounter: Payer: Self-pay | Admitting: Sports Medicine

## 2014-07-01 ENCOUNTER — Ambulatory Visit (INDEPENDENT_AMBULATORY_CARE_PROVIDER_SITE_OTHER): Payer: Self-pay | Admitting: Sports Medicine

## 2014-07-01 ENCOUNTER — Telehealth: Payer: Self-pay | Admitting: Internal Medicine

## 2014-07-01 VITALS — BP 107/76 | Ht 63.0 in | Wt 130.0 lb

## 2014-07-01 DIAGNOSIS — M25512 Pain in left shoulder: Secondary | ICD-10-CM

## 2014-07-01 DIAGNOSIS — M25511 Pain in right shoulder: Secondary | ICD-10-CM

## 2014-07-01 DIAGNOSIS — R634 Abnormal weight loss: Secondary | ICD-10-CM

## 2014-07-01 MED ORDER — PREDNISONE 10 MG PO TABS: 10.0000 mg | ORAL_TABLET | Freq: Every day | ORAL | Status: DC

## 2014-07-01 NOTE — Assessment & Plan Note (Signed)
-  CXR ordered -Recommend complete cancer screening work-up.

## 2014-07-01 NOTE — Progress Notes (Signed)
   Subjective:    Patient ID: Julie Hensley, female    DOB: 12-Dec-1960, 54 y.o.   MRN: [id number]  HPI [name] is a 54 year-old right hand dominant female who presents with bilateral shoulder pain. Onset was towards the end of last May (about 9 months ago) without any known acute injury. Location is bilateral antero-lateral shoulders. Symptoms aggravated with raising her arms above shoulder heights. Relieved with rest, but no much else. She says she had tried a Medrol Dose pack for 6 days in the past with little relief. She also complains of chronic neck and lower back pain (hx of scoliotic curve), but these were unable to be addressed today. She denies any numbness, tingling, or weakness of her shoulder pain. She says she was diagnosed with rheumatoid arthritis in 2005, but has been unable to see a Rheumatologist due to her financial situation. She says she is currently homeless and has difficulty even affording $4 medications. On top of this, she endorses a 30 lb wt loss over a 3 month period that started last May (going from 156-123 lbs unintentionally). She wakes up at night with sweats. She has been post-menopausal for some time now. She has an 80+ pack year tobacco history. She complains of some chronic constipation (not on chronic narcotics) without melena or hematochezia. Her family history is significant for arthritis (unknown if RA), and negative for any known malignancies. She has not had any mammography or colonoscopy screening.  She recently underwent fairly extensive hematologic work-up with HgbA1c, uric acid, TSH, CBC, CMP, RF, and CRP. RF was elevated to 26, but remaining labs were overall unremarkable.  She has managed to cut back to 2 cigarettes per day on Wellbutrin.  Past medical history, social history, medications, and allergies were reviewed and are up to date in the chart.  Review of Systems 11 point review of systems was performed and was otherwise negative unless noted in the  history of present illness.    Objective:   Physical Exam BP 107/76 mmHg  Ht 5\' 3"  (1.6 m)  Wt 130 lb (58.968 kg)  BMI 23.03 kg/m2 GEN: The patient is somewhat thin female and in no acute distress.  She is awake alert and oriented x3. SKIN: warm and well-perfused, no rash  EXTR: No lower extremity edema or calf tenderness Neuro: Strength 5/5 globally. Sensation intact throughout. DTRs 2/4 bilaterally. No focal deficits. Vasc: +2 bilateral distal pulses. No edema.  MSK: Examination of the bilateral shoulders reveals active flexion to 120 degrees, abduction to 140 degrees, adducted ER to 50 degrees, int rot/ext to L1. Passively full range of motion. Significant impingement signs bilaterally. Intact rotator cuff strength testing, however with painful impingement. Neg Speed's. Neg Jobe's. +Hawkins. Neg abd compression test. Minimal tenderness at the [facility] joints. Examination of the bilateral hands reveals previous injury of the 4th digit from old trauma. No rheumatoid nodules palpated.  X-rays 02/07/14: Previous left clavicle fracture healed. Otherwise, minimal degenerative GH changes. Acromions without significant spurring.    Assessment & Plan:  Please see problem based assessment and plan in the problem list.

## 2014-07-01 NOTE — Assessment & Plan Note (Addendum)
Bilateral shoulder synovitis, no frozen shoulder or signs of cuff pathology. Also with chronic neck and back pain, but shoulder pain does not seem to be radicular. Concern for possible underlying inflammatory arthritis such as RA. Also with associated chronic fevers, chills, 30lb wt loss over past 3 months, malaise. Family hx of RA per patient. No FHx of malignancies. -80+ pack yr hx, cutting back -Will try Medrol Dose Pack x 12 days.  -Consider more advanced RA labs if improves, but persistent generalized pain. Consider anti-CCP antibodies. -Recommend comprehensive work-up for weight loss to include screening for breast and colon cancer (hx of constipation as well). Previous labs reviewed. -Ordered CXR to eval pulmonary causes -f/u 1 month or sooner if needed

## 2014-07-01 NOTE — Telephone Encounter (Signed)
Patient presented to facility to request a referral to have a colonoscopy and to do a mammogram, patient stated that the orthopaedic doctor recommended for her to have both. Please f/u with pt. For any questions.

## 2014-07-04 ENCOUNTER — Ambulatory Visit
Admission: RE | Admit: 2014-07-04 | Discharge: 2014-07-04 | Disposition: A | Discharge status: Home / Self Care | Payer: No Typology Code available for payment source | Source: Ambulatory Visit | Attending: Sports Medicine | Admitting: Sports Medicine

## 2014-07-04 ENCOUNTER — Other Ambulatory Visit: Payer: Self-pay | Admitting: Sports Medicine

## 2014-07-04 DIAGNOSIS — R634 Abnormal weight loss: Secondary | ICD-10-CM

## 2014-07-07 NOTE — Telephone Encounter (Signed)
Langley Gauss can you take care of this

## 2014-07-29 ENCOUNTER — Ambulatory Visit (INDEPENDENT_AMBULATORY_CARE_PROVIDER_SITE_OTHER): Payer: Self-pay | Admitting: Sports Medicine

## 2014-07-29 ENCOUNTER — Encounter: Payer: Self-pay | Admitting: Sports Medicine

## 2014-07-29 VITALS — Ht 63.0 in | Wt 130.0 lb

## 2014-07-29 DIAGNOSIS — M25512 Pain in left shoulder: Secondary | ICD-10-CM

## 2014-07-29 DIAGNOSIS — M25511 Pain in right shoulder: Secondary | ICD-10-CM

## 2014-07-29 LAB — RHEUMATOID FACTOR: Rhuematoid fact SerPl-aCnc: 21 IU/mL — ABNORMAL HIGH (ref <=14)

## 2014-07-29 MED ORDER — METHYLPREDNISOLONE ACETATE 40 MG/ML IJ SUSP
40.0000 mg | Freq: Once | INTRAMUSCULAR | Status: AC
  Administered 2014-07-29: 40 mg via INTRA_ARTICULAR

## 2014-07-29 NOTE — Progress Notes (Signed)
Subjective:    Patient ID: Julie Hensley, female    DOB: November 19, 1960, 54 y.o.   MRN: [id number]  HPI Julie Hensley is a 54 year-old right hand dominant female who presents for follow-up of bilateral shoulder pain. Recalls that onset was towards the end of last May without any known acute injury. Location is bilateral antero-lateral shoulders. Symptoms aggravated with raising her arms above shoulder heights. Relieved with rest, but no much else. She has tried a Medrol Dose pack for 12 days with temporary relief. She also complains of chronic neck and lower back pain (hx of scoliotic curve). She denies any numbness, tingling, or weakness of her shoulder pain. Recall that she was diagnosed with rheumatoid arthritis in 2005, but has been unable to see a Rheumatologist due to her financial situation. Today, she says that her pain persists. She says that her range of motion has significantly improved since our last visit, but she recently overdid use of the bilateral shoulders when she is doing yard work. Location of pain is the bilateral superior lateral shoulders. Symptoms are aggravated with doing frequent overhead work. Right shoulder is slightly worse than left.  For her weight loss, which she endorses a 30 lb wt loss over a 3 month period that started last May (going from 156-123 lbs unintentionally). She wakes up at night with sweats. She has been post-menopausal for some time now. She has an 80+ pack year tobacco history. She complains of some chronic constipation (not on chronic narcotics) without melena or hematochezia. Her family history is significant for arthritis (unknown if RA), and negative for any known malignancies. She is anticipating upcoming mammography and colonoscopy screening. We ordered a chest x-ray at our last visit, which was without abnormality. Her bilateral shoulders also appeared normal without acromion spurring or significant OA from what we could tell.  She recently underwent fairly  extensive hematologic work-up with HgbA1c, uric acid, TSH, CBC, CMP, RF, and CRP. RF was elevated to 26, but remaining labs were overall unremarkable.  She has managed to cut back to 2 cigarettes per day on Wellbutrin.  Past medical history, social history, medications, and allergies were reviewed and are up to date in the chart.  Review of Systems 7 point review of systems was performed and was otherwise negative unless noted in the history of present illness.    Objective:   Physical Exam Ht 5\' 3"  (1.6 m)  Wt 130 lb (58.968 kg)  BMI 23.03 kg/m2 GEN: The patient is well-developed well-nourished female and in no acute distress.  She is awake alert and oriented x3. SKIN: warm and well-perfused, no rash  EXTR: No lower extremity edema or calf tenderness Neuro: Strength 5/5 globally. Sensation intact throughout. DTRs 2/4 bilaterally. No focal deficits. Vasc: +2 bilateral distal pulses. No edema.  MSK: Atrophy: none   Cervical ROM Full  Shoulder ROM: Right <---> Left   Forward flexion 170<--->180 ER at side 60<--->60 Abd ER 90<--->90 Abd IR 60<--->60 IR up back T6<--->T6  TTP:  AC joint:  -  Supraspinatus insertion:  +  Subsca/biceps:  -  Periscapular:  -  Trap:  -  Cuff: Impingement/cuff: Hawkins +   Jobes: [_Atrophy: none   Cervical ROM Full  Shoulder ROM: Right <---> Left   Forward flexion 180<--->180 ER at side 60<--->60 Abd ER 90<--->90 Abd IR 60<--->60 IR up back T6<--->T6  TTP:  AC joint:  -  Supraspinatus insertion:  -  Subsca/biceps:  -  Periscapular:  -  Trap:  -  Cuff: Impingement/cuff: Hawkins -   Jobes: +   FF strength:5/5   ER strength:5/5   Abdominal compression test:- Lag signs:None  Laxity/instabilty:  -  Yergason:  -           Speeds:  -    Crank:  -                     Active Compression: -  Neurovascular: Normal sensation to light touch in median ulnar and radial nerve distribution with good strength in hand intrinsics grip and EPL, 2+ radial  pulse.    FF strength:5/5   ER strength:5/5   Abdominal compression test:- Lag signs:None  Laxity/instabilty:  -  Yergason:  -           Speeds:  -    Crank:  -                     Active Compression: -  Neurovascular: Normal sensation to light touch in median ulnar and radial nerve distribution with good strength in hand intrinsics grip and EPL, 2+ radial pulse.  Procedure: The procedure was explained to the patient in detail, all questions were answered.  After obtaining informed verbal consent, the right shoulder was sterilely prepped with alcohol. Using strict sterile technique, a 22 gauge, 1  inch needle was inserted inferior to the anterolateral edge of the right acromion.  The needle is directed medially and slightly anteriorly.  A mixture of 3 mL 1% Xylocaine, 3 mL 1/2% Marcaine, and 3 mL betamethasone was injected without resistance. The needle was removed, and a sterile bandage was applied. The patient tolerated the procedure well and was observed for 5-10 minutes.  Post injection instructions, including signs and symptoms of complications were discussed.    Assessment & Plan:  Please see problem based assessment and plan in the problem list.

## 2014-07-29 NOTE — Assessment & Plan Note (Signed)
-  Continue cancer screening work-up with colonoscopy and mammogram, encouraged. -Encouraged tobacco cessation  -Believe she still likely has underlying Rheumatologic etiology to her multiple msk complaints given lack of significant OA. -Psychosocial factors contributing to pain includes anxiety  -Injected right subacromial shoulder today to see if this can calm down some of the impingement she seems to have on exam. Encouraged by improvement in range of motion. Cuff strength was good. Encouraged to gently use right shoulder with gradual increase in activity.  -For RA work-up, would like to repeat Rheumatoid Factor, obtain ESR and anti-CCP antibodies. -If still abnormal, will encourage her to follow-up with Rheumatologist (may need to pay co-pay for [facility] if resources limited).  -Can address back if her symptoms persist, but would likely be at our limit of medication management if etiology still pointing towards rheumatologic cause.

## 2014-07-30 ENCOUNTER — Telehealth: Payer: Self-pay | Admitting: Sports Medicine

## 2014-07-30 ENCOUNTER — Other Ambulatory Visit: Payer: Self-pay | Admitting: Sports Medicine

## 2014-07-30 LAB — SEDIMENTATION RATE: Sed Rate: 17 mm/hr (ref 0–30)

## 2014-07-30 LAB — CYCLIC CITRUL PEPTIDE ANTIBODY, IGG: Cyclic Citrullin Peptide Ab: <2 U/mL (ref 0.0–5.0)

## 2014-07-30 MED ORDER — AMITRIPTYLINE HCL 25 MG PO TABS
ORAL_TABLET | ORAL | Status: DC
Start: 2014-07-30 — End: 2014-11-10

## 2014-07-30 NOTE — Telephone Encounter (Signed)
Called patient. Discussed lab results. Low positive RF as before. Negative ESR and anti-CCP. Unclear etiology to her bilateral shoulder and lower back pain. Potentially radicular symptoms, but not classic for. Patient undergoing mammogram and colonoscopy in planning process. Doing OK, s/p shoulder injection yesterday. Will hopefully start to see improvement. Question if patient has non-rheumatoid arthritis inflammatory disorder. Discussed starting amitriptyline 73m qHS with titration to 551mqHS in 3 weeks, then follow-up in 6 weeks to reassess, consider [id number] seeing some benefit but still having symptoms without significant side-effects. This may help her anxiety symptoms as well. Patient will call to arrange appt, call with any questions.

## 2014-09-24 ENCOUNTER — Ambulatory Visit: Payer: Self-pay | Attending: Internal Medicine | Admitting: Internal Medicine

## 2014-09-24 VITALS — BP 162/94 | HR 97 | Wt 133.0 lb

## 2014-09-24 DIAGNOSIS — Z791 Long term (current) use of non-steroidal anti-inflammatories (NSAID): Secondary | ICD-10-CM | POA: Insufficient documentation

## 2014-09-24 DIAGNOSIS — K219 Gastro-esophageal reflux disease without esophagitis: Secondary | ICD-10-CM | POA: Insufficient documentation

## 2014-09-24 DIAGNOSIS — I10 Essential (primary) hypertension: Secondary | ICD-10-CM | POA: Insufficient documentation

## 2014-09-24 DIAGNOSIS — F411 Generalized anxiety disorder: Secondary | ICD-10-CM

## 2014-09-24 DIAGNOSIS — F17200 Nicotine dependence, unspecified, uncomplicated: Secondary | ICD-10-CM

## 2014-09-24 DIAGNOSIS — Z720 Tobacco use: Secondary | ICD-10-CM

## 2014-09-24 DIAGNOSIS — Z7952 Long term (current) use of systemic steroids: Secondary | ICD-10-CM | POA: Insufficient documentation

## 2014-09-24 DIAGNOSIS — F17210 Nicotine dependence, cigarettes, uncomplicated: Secondary | ICD-10-CM | POA: Insufficient documentation

## 2014-09-24 DIAGNOSIS — Z79899 Other long term (current) drug therapy: Secondary | ICD-10-CM | POA: Insufficient documentation

## 2014-09-24 DIAGNOSIS — F419 Anxiety disorder, unspecified: Secondary | ICD-10-CM | POA: Insufficient documentation

## 2014-09-24 DIAGNOSIS — G4700 Insomnia, unspecified: Secondary | ICD-10-CM | POA: Insufficient documentation

## 2014-09-24 DIAGNOSIS — R110 Nausea: Secondary | ICD-10-CM | POA: Insufficient documentation

## 2014-09-24 MED ORDER — HYDROCHLOROTHIAZIDE 25 MG PO TABS: 25.0000 mg | ORAL_TABLET | Freq: Every day | ORAL | Status: DC

## 2014-09-24 MED ORDER — AMLODIPINE BESYLATE 10 MG PO TABS: 10.0000 mg | ORAL_TABLET | Freq: Every day | ORAL | Status: DC

## 2014-09-24 MED ORDER — HYDROXYZINE HCL 10 MG PO TABS: 10.0000 mg | ORAL_TABLET | Freq: Three times a day (TID) | ORAL | Status: DC | PRN

## 2014-09-24 MED ORDER — NICOTINE 21 MG/24HR TD PT24: 21.0000 mg | MEDICATED_PATCH | Freq: Every day | TRANSDERMAL | Status: DC

## 2014-09-24 MED ORDER — RANITIDINE HCL 150 MG PO TABS: 150.0000 mg | ORAL_TABLET | Freq: Every day | ORAL | Status: DC

## 2014-09-24 NOTE — Progress Notes (Signed)
Pt present to clinic for c/o headaches and not being able to sleep. She states that the medicine they rx is not working.

## 2014-09-24 NOTE — Progress Notes (Signed)
MRN: [id number] Name: Julie Hensley  Sex: female Age: 54 y.o. DOB: 09/03/1960  Allergies: Vicodin  Chief Complaint  Patient presents with  . Headache  . Insomnia    HPI: Patient is 54 y.o. female who has  history of hypertension, tobacco abuse, anxiety/depression, insomnia, GERD comes today for follow up she ran out of her medications, apparently she could not afford her medications and ran out, she is also following up with sports medicine and was started on amitriptyline she is taking 15 mg each bedtime to help her with the insomnia, chronic headaches, as per patient it is still not helping her with the sleep, she is also smoking cigarettes, she was prescribed Wellbutrin which as per patient she stopped taking it because she didn't like it. Today her blood pressure is elevated denies any chest pain or shortness of breath she needs refill on her meds.  Past Medical History  Diagnosis Date  . Hypertension     HBP at [employer] machine but never been to doctor for it.   . Complication of anesthesia   . PONV (postoperative nausea and vomiting)   . Arthritis   . Anxiety     Past Surgical History  Procedure Laterality Date  . Appendectomy    . Tonsillectomy    . Mandible fracture surgery        Medication List       This list is accurate as of: 09/24/14  5:36 PM.  Always use your most recent med list.               ALPRAZolam 0.5 MG tablet  Commonly known as:  XANAX  Take 0.5 mg by mouth 3 (three) times daily as needed for anxiety.     amitriptyline 25 MG tablet  Commonly known as:  ELAVIL  Start with 25mg  once nightly, increase to 50mg  once nightly in 3 weeks.     amLODipine 10 MG tablet  Commonly known as:  NORVASC  Take 1 tablet (10 mg total) by mouth daily.     guanFACINE 1 MG tablet  Commonly known as:  TENEX  Take 1 mg by mouth at bedtime.     hydrochlorothiazide 25 MG tablet  Commonly known as:  HYDRODIURIL  Take 1 tablet (25 mg total) by mouth daily.       HYDROcodone-acetaminophen 5-325 MG per tablet  Commonly known as:  NORCO/VICODIN  Take 1 tablet by mouth every 6 (six) hours as needed for moderate pain.     hydrOXYzine 10 MG tablet  Commonly known as:  ATARAX/VISTARIL  Take 1 tablet (10 mg total) by mouth 3 (three) times daily as needed for anxiety or nausea.     LINZESS PO  Take by mouth.     magnesium hydroxide 400 MG/5ML suspension  Commonly known as:  MILK OF MAGNESIA  Take 30 mLs by mouth daily as needed for mild constipation.     meloxicam 15 MG tablet  Commonly known as:  MOBIC  Take 1 tablet (15 mg total) by mouth daily.     nicotine 21 mg/24hr patch  Commonly known as:  NICODERM CQ  Place 1 patch (21 mg total) onto the skin daily.     omeprazole 40 MG capsule  Commonly known as:  PRILOSEC  Take 1 capsule (40 mg total) by mouth daily.     ondansetron 4 MG tablet  Commonly known as:  ZOFRAN  Take 1 tablet (4 mg total) by mouth every 6 (six)  hours as needed for nausea.     oxyCODONE-acetaminophen 5-325 MG per tablet  Commonly known as:  PERCOCET/ROXICET  Take 1 tablet by mouth every 8 (eight) hours as needed for moderate pain or severe pain.     pantoprazole 20 MG tablet  Commonly known as:  PROTONIX  Take 1 tablet (20 mg total) by mouth daily.     polyethylene glycol powder powder  Commonly known as:  MIRALAX  Take 255 g by mouth once.     predniSONE 10 MG tablet  Commonly known as:  DELTASONE  Take 1 tablet (10 mg total) by mouth daily with breakfast.     ranitidine 150 MG tablet  Commonly known as:  ZANTAC  Take 1 tablet (150 mg total) by mouth at bedtime.     traZODone 50 MG tablet  Commonly known as:  DESYREL  Take 1 tablet (50 mg total) by mouth at bedtime.     traZODone 50 MG tablet  Commonly known as:  DESYREL  Take 1 tablet (50 mg total) by mouth at bedtime.     Vitamin D (Ergocalciferol) 50000 UNITS Caps capsule  Commonly known as:  DRISDOL  Take 1 capsule (50,000 Units total) by  mouth every 7 (seven) days.        Meds ordered this encounter  Medications  . nicotine (NICODERM CQ) 21 mg/24hr patch    Sig: Place 1 patch (21 mg total) onto the skin daily.    Dispense:  28 patch    Refill:  0  . ranitidine (ZANTAC) 150 MG tablet    Sig: Take 1 tablet (150 mg total) by mouth at bedtime.    Dispense:  30 tablet    Refill:  3  . hydrOXYzine (ATARAX/VISTARIL) 10 MG tablet    Sig: Take 1 tablet (10 mg total) by mouth 3 (three) times daily as needed for anxiety or nausea.    Dispense:  30 tablet    Refill:  1  . amLODipine (NORVASC) 10 MG tablet    Sig: Take 1 tablet (10 mg total) by mouth daily.    Dispense:  30 tablet    Refill:  3  . hydrochlorothiazide (HYDRODIURIL) 25 MG tablet    Sig: Take 1 tablet (25 mg total) by mouth daily.    Dispense:  30 tablet    Refill:  3     There is no immunization history on file for this patient.  Family History  Problem Relation Age of Onset  . Anesthesia problems Neg Hx   . Hypertension Mother   . Heart disease Mother   . Hypertension Father   . Stroke Father   . Hypertension Brother   . Diabetes Paternal Grandmother     History  Substance Use Topics  . Smoking status: Current Every Day Smoker -- 0.50 packs/day for 40 years    Types: Cigarettes  . Smokeless tobacco: Not on file  . Alcohol Use: No     Comment: has not had alcohol in a month.  Previously a case of beer daily    Review of Systems   As noted in HPI  Filed Vitals:   09/24/14 1700  BP: 162/94  Pulse: 97    Physical Exam  Physical Exam  Constitutional: No distress.  Eyes: EOM are normal. Pupils are equal, round, and reactive to light.  Cardiovascular: Normal rate and regular rhythm.   Pulmonary/Chest: Breath sounds normal. No respiratory distress. She has no wheezes. She has no rales.  Musculoskeletal: She exhibits no edema.    CBC    Component Value Date/Time   WBC 5.9 06/10/2014 1015   RBC 4.58 06/10/2014 1015   HGB 13.8  06/10/2014 1015   HCT 40.9 06/10/2014 1015   PLT 236 06/10/2014 1015   MCV 89.3 06/10/2014 1015   LYMPHSABS 1.9 06/10/2014 1015   MONOABS 0.4 06/10/2014 1015   EOSABS 0.2 06/10/2014 1015   BASOSABS 0.1 06/10/2014 1015    CMP     Component Value Date/Time   NA 140 06/10/2014 1015   K 4.2 06/10/2014 1015   CL 103 06/10/2014 1015   CO2 28 06/10/2014 1015   GLUCOSE 87 06/10/2014 1015   BUN 7 06/10/2014 1015   CREATININE 0.75 06/10/2014 1015   CREATININE 0.69 02/07/2014 1005   CALCIUM 9.9 06/10/2014 1015   PROT 6.6 06/10/2014 1015   ALBUMIN 4.2 06/10/2014 1015   AST 16 06/10/2014 1015   ALT 12 06/10/2014 1015   ALKPHOS 85 06/10/2014 1015   BILITOT 0.3 06/10/2014 1015   GFRNONAA >89 06/10/2014 1015   GFRNONAA >90 02/07/2014 1005   GFRAA >89 06/10/2014 1015   GFRAA >90 02/07/2014 1005    Lab Results  Component Value Date/Time   CHOL 201* 06/10/2014 10:15 AM    Lab Results  Component Value Date/Time   HGBA1C 5.2 06/10/2014 10:15 AM    Lab Results  Component Value Date/Time   AST 16 06/10/2014 10:15 AM    Assessment and Plan  Essential hypertension - Plan: resume back on  amLODipine (NORVASC) 10 MG tablet, hydrochlorothiazide (HYDRODIURIL) 25 MG tablet, advise patient for DASH diet and could smoking   Smoking - Plan: nicotine (NICODERM CQ) 21 mg/24hr patch  Insomnia Patient take melatonin, also will try to follow up with her sports medicine probably she needs to increase the dose of amitriptyline  Anxiety state/Nausea  - Plan: hydrOXYzine (ATARAX/VISTARIL) 10 MG tablet  Gastroesophageal reflux disease, esophagitis presence not specified - Plan: lifestyle modification, ranitidine (ZANTAC) 150 MG tablet    Return in about 3 months (around 12/25/2014), or if symptoms worsen or fail to improve.   This note has been created with Surveyor, quantity. Any transcriptional errors are unintentional.    Lorayne Marek,  MD

## 2014-11-10 ENCOUNTER — Ambulatory Visit (INDEPENDENT_AMBULATORY_CARE_PROVIDER_SITE_OTHER): Payer: Self-pay | Admitting: Family Medicine

## 2014-11-10 VITALS — BP 128/67 | HR 92 | Ht 63.0 in | Wt 132.0 lb

## 2014-11-10 DIAGNOSIS — K219 Gastro-esophageal reflux disease without esophagitis: Secondary | ICD-10-CM

## 2014-11-10 DIAGNOSIS — M79646 Pain in unspecified finger(s): Secondary | ICD-10-CM

## 2014-11-10 DIAGNOSIS — M25549 Pain in joints of unspecified hand: Secondary | ICD-10-CM

## 2014-11-10 MED ORDER — GABAPENTIN 300 MG PO CAPS: ORAL_CAPSULE | ORAL | Status: DC

## 2014-11-10 MED ORDER — OMEPRAZOLE 40 MG PO CPDR: 40.0000 mg | DELAYED_RELEASE_CAPSULE | Freq: Every day | ORAL | Status: DC

## 2014-11-11 NOTE — Progress Notes (Signed)
   Subjective:    Patient ID: Julie Hensley, female    DOB: 15-Nov-1960, 54 y.o.   MRN: [id number]  HPI Multiple arthralgias, has had work up including lab showing + RF at low titer. Has no insurance so unable to see rheumatology. Diagnosis is unclear. At last ov she was told it is likely fibromyalgia. Was tried on  amitriptylene--here for f/u. Said meds did not  nhelp and actually increased her insomnia.   Review of Systems Pertinent review of systems: negative for fever or unusual weight change.     Objective:   Physical Exam  Vital signs reviewed. GENERAL: Well-developed, well-nourished, no acute distress. CARDIOVASCULAR: Regular rate and rhythm  MSK: Movement of extremity x 4.deformity PIP and DIP joints multiple fingers, boutenierre deformity ring fibnger right hand.        Assessment & Plan:  I also refilled her GERD medicine which she was out of  F/u 3-4 weeks

## 2014-11-11 NOTE — Assessment & Plan Note (Signed)
Will try gabapentin 

## 2014-12-12 ENCOUNTER — Ambulatory Visit: Payer: Self-pay | Admitting: Family Medicine

## 2014-12-16 ENCOUNTER — Emergency Department (HOSPITAL_COMMUNITY): Payer: Self-pay

## 2014-12-16 ENCOUNTER — Emergency Department (HOSPITAL_COMMUNITY)
Admission: EM | Admit: 2014-12-16 | Discharge: 2014-12-17 | Disposition: A | Discharge status: Home / Self Care | Payer: Self-pay | Attending: Emergency Medicine | Admitting: Emergency Medicine

## 2014-12-16 ENCOUNTER — Encounter (HOSPITAL_COMMUNITY): Payer: Self-pay | Admitting: *Deleted

## 2014-12-16 DIAGNOSIS — R200 Anesthesia of skin: Secondary | ICD-10-CM | POA: Insufficient documentation

## 2014-12-16 DIAGNOSIS — I10 Essential (primary) hypertension: Secondary | ICD-10-CM | POA: Insufficient documentation

## 2014-12-16 DIAGNOSIS — Z79899 Other long term (current) drug therapy: Secondary | ICD-10-CM | POA: Insufficient documentation

## 2014-12-16 DIAGNOSIS — R079 Chest pain, unspecified: Secondary | ICD-10-CM | POA: Insufficient documentation

## 2014-12-16 DIAGNOSIS — Z8739 Personal history of other diseases of the musculoskeletal system and connective tissue: Secondary | ICD-10-CM | POA: Insufficient documentation

## 2014-12-16 DIAGNOSIS — I639 Cerebral infarction, unspecified: Secondary | ICD-10-CM

## 2014-12-16 DIAGNOSIS — R112 Nausea with vomiting, unspecified: Secondary | ICD-10-CM | POA: Insufficient documentation

## 2014-12-16 DIAGNOSIS — Z720 Tobacco use: Secondary | ICD-10-CM | POA: Insufficient documentation

## 2014-12-16 DIAGNOSIS — G528 Disorders of other specified cranial nerves: Secondary | ICD-10-CM | POA: Insufficient documentation

## 2014-12-16 DIAGNOSIS — F419 Anxiety disorder, unspecified: Secondary | ICD-10-CM | POA: Insufficient documentation

## 2014-12-16 LAB — I-STAT TROPONIN, ED
TROPONIN I, POC: 0 ng/mL (ref 0.00–0.08)
Troponin i, poc: 0 ng/mL (ref 0.00–0.08)

## 2014-12-16 LAB — I-STAT CHEM 8, ED
BUN: 10 mg/dL (ref 6–20)
CREATININE: 1.5 mg/dL — AB (ref 0.44–1.00)
Calcium, Ion: 1.21 mmol/L (ref 1.12–1.23)
Chloride: 101 mmol/L (ref 101–111)
GLUCOSE: 132 mg/dL — AB (ref 65–99)
HEMATOCRIT: 52 % — AB (ref 36.0–46.0)
HEMOGLOBIN: 17.7 g/dL — AB (ref 12.0–15.0)
Potassium: 2.9 mmol/L — ABNORMAL LOW (ref 3.5–5.1)
Sodium: 139 mmol/L (ref 135–145)
TCO2: 23 mmol/L (ref 0–100)

## 2014-12-16 LAB — BASIC METABOLIC PANEL
Anion gap: 16 — ABNORMAL HIGH (ref 5–15)
BUN: 8 mg/dL (ref 6–20)
CO2: 22 mmol/L (ref 22–32)
CREATININE: 1.53 mg/dL — AB (ref 0.44–1.00)
Calcium: 10.8 mg/dL — ABNORMAL HIGH (ref 8.9–10.3)
Chloride: 100 mmol/L — ABNORMAL LOW (ref 101–111)
GFR calc Af Amer: 43 mL/min — ABNORMAL LOW (ref >60)
GFR, EST NON AFRICAN AMERICAN: 37 mL/min — AB (ref >60)
GLUCOSE: 137 mg/dL — AB (ref 65–99)
POTASSIUM: 3 mmol/L — AB (ref 3.5–5.1)
Sodium: 138 mmol/L (ref 135–145)

## 2014-12-16 LAB — I-STAT BETA HCG BLOOD, ED (MC, WL, AP ONLY): I-stat hCG, quantitative: 5.1 m[IU]/mL — ABNORMAL HIGH (ref <5)

## 2014-12-16 LAB — CBC
HEMATOCRIT: 45.6 % (ref 36.0–46.0)
Hemoglobin: 15.9 g/dL — ABNORMAL HIGH (ref 12.0–15.0)
MCH: 30.4 pg (ref 26.0–34.0)
MCHC: 34.9 g/dL (ref 30.0–36.0)
MCV: 87.2 fL (ref 78.0–100.0)
Platelets: 298 10*3/uL (ref 150–400)
RBC: 5.23 MIL/uL — ABNORMAL HIGH (ref 3.87–5.11)
RDW: 13.2 % (ref 11.5–15.5)
WBC: 10 10*3/uL (ref 4.0–10.5)

## 2014-12-16 MED ORDER — SODIUM CHLORIDE 0.9 % IV BOLUS (SEPSIS)
1000.0000 mL | Freq: Once | INTRAVENOUS | Status: AC
  Administered 2014-12-16: 1000 mL via INTRAVENOUS

## 2014-12-16 MED ORDER — POTASSIUM CHLORIDE CRYS ER 20 MEQ PO TBCR
40.0000 meq | EXTENDED_RELEASE_TABLET | Freq: Once | ORAL | Status: AC
  Administered 2014-12-17: 40 meq via ORAL
  Filled 2014-12-16: qty 2

## 2014-12-16 MED ORDER — ASPIRIN 81 MG PO CHEW
324.0000 mg | CHEWABLE_TABLET | Freq: Once | ORAL | Status: AC
  Administered 2014-12-16: 324 mg via ORAL
  Filled 2014-12-16: qty 4

## 2014-12-16 MED ORDER — LORAZEPAM 2 MG/ML IJ SOLN
1.0000 mg | Freq: Once | INTRAMUSCULAR | Status: AC
  Administered 2014-12-16: 1 mg via INTRAVENOUS
  Filled 2014-12-16: qty 1

## 2014-12-16 MED ORDER — NITROGLYCERIN 0.4 MG SL SUBL
0.4000 mg | SUBLINGUAL_TABLET | SUBLINGUAL | Status: DC | PRN
  Filled 2014-12-16: qty 1

## 2014-12-16 MED ORDER — ONDANSETRON HCL 4 MG/2ML IJ SOLN
4.0000 mg | Freq: Once | INTRAMUSCULAR | Status: AC
  Administered 2014-12-16: 4 mg via INTRAVENOUS
  Filled 2014-12-16: qty 2

## 2014-12-16 MED ORDER — IOHEXOL 350 MG/ML SOLN
100.0000 mL | Freq: Once | INTRAVENOUS | Status: AC | PRN
  Administered 2014-12-16: 100 mL via INTRAVENOUS

## 2014-12-16 NOTE — ED Provider Notes (Signed)
CSN: [id number]     Arrival date & time 12/16/14  1826 History   First MD Initiated Contact with Patient 12/16/14 1839     Chief Complaint  Patient presents with  . Chest Pain  . Numbness     (Consider location/radiation/quality/duration/timing/severity/associated sxs/prior Treatment) Patient is a 54 y.o. female presenting with chest pain. The history is provided by the patient.  Chest Pain Pain location:  L chest Pain quality: tightness   Pain radiates to:  L jaw and neck Pain radiates to the back: no   Pain severity:  Severe Onset quality:  Sudden Duration:  30 minutes Timing:  Constant Progression:  Improving Chronicity:  New Context: at rest   Relieved by:  Nothing Worsened by:  Nothing tried Associated symptoms: nausea, shortness of breath and vomiting   Associated symptoms: no abdominal pain and no fever   Associated symptoms comment:  Tingling in L face, L arm, L leg   Past Medical History  Diagnosis Date  . Hypertension     HBP at [employer] machine but never been to doctor for it.   . Complication of anesthesia   . PONV (postoperative nausea and vomiting)   . Arthritis   . Anxiety    Past Surgical History  Procedure Laterality Date  . Appendectomy    . Tonsillectomy    . Mandible fracture surgery     Family History  Problem Relation Age of Onset  . Anesthesia problems Neg Hx   . Hypertension Mother   . Heart disease Mother   . Hypertension Father   . Stroke Father   . Hypertension Brother   . Diabetes Paternal Grandmother    Social History  Substance Use Topics  . Smoking status: Current Every Day Smoker -- 0.50 packs/day for 40 years    Types: Cigarettes  . Smokeless tobacco: None  . Alcohol Use: No     Comment: has not had alcohol in a month.  Previously a case of beer daily   OB History    Gravida Para Term Preterm AB TAB SAB Ectopic Multiple Living   1 1 1  0 0 0 0 0 0 1     Review of Systems  Constitutional: Negative for fever.   Respiratory: Positive for chest tightness and shortness of breath.   Cardiovascular: Positive for chest pain.  Gastrointestinal: Positive for nausea and vomiting. Negative for abdominal pain.  All other systems reviewed and are negative.     Allergies  Vicodin  Home Medications   Prior to Admission medications   Medication Sig Start Date End Date Taking? Authorizing Provider  ALPRAZolam Duanne Moron) 0.5 MG tablet Take 0.5 mg by mouth 3 (three) times daily as needed for anxiety.    Historical Provider, MD  amLODipine (NORVASC) 10 MG tablet Take 1 tablet (10 mg total) by mouth daily. 09/24/14   Lorayne Marek, MD  gabapentin (NEURONTIN) 300 MG capsule Take one by mouth qhs 11/10/14   Dickie La, MD  guanFACINE (TENEX) 1 MG tablet Take 1 mg by mouth at bedtime.    Historical Provider, MD  hydrochlorothiazide (HYDRODIURIL) 25 MG tablet Take 1 tablet (25 mg total) by mouth daily. 09/24/14   Lorayne Marek, MD  hydrOXYzine (ATARAX/VISTARIL) 10 MG tablet Take 1 tablet (10 mg total) by mouth 3 (three) times daily as needed for anxiety or nausea. 09/24/14   Lorayne Marek, MD  omeprazole (PRILOSEC) 40 MG capsule Take 1 capsule (40 mg total) by mouth daily. 11/10/14   Clarise Cruz  Zadie Rhine, MD   BP 107/89 mmHg  Pulse 98  Temp(Src) 97.6 F (36.4 C) (Oral)  Resp 18  SpO2 100% Physical Exam  Constitutional: She is oriented to person, place, and time. She appears well-developed and well-nourished. No distress.  HENT:  Head: Normocephalic and atraumatic.  Mouth/Throat: Oropharynx is clear and moist.  Eyes: EOM are normal. Pupils are equal, round, and reactive to light.  Neck: Normal range of motion. Neck supple.  Cardiovascular: Normal rate and regular rhythm.  Exam reveals no friction rub.   No murmur heard. Pulmonary/Chest: Effort normal and breath sounds normal. No respiratory distress. She has no wheezes. She has no rales.  Abdominal: Soft. She exhibits no distension. There is no tenderness. There is no  rebound.  Musculoskeletal: Normal range of motion. She exhibits no edema.  Neurological: She is alert and oriented to person, place, and time. A cranial nerve deficit (mild altered light touch on L face) and sensory deficit (L arm and leg altered light touch sensation) is present. She exhibits abnormal muscle tone (mild weakness in L leg and L arm). GCS eye subscore is 4. GCS verbal subscore is 5. GCS motor subscore is 6.  Skin: No rash noted. She is not diaphoretic.  Nursing note and vitals reviewed.   ED Course  Procedures (including critical care time) Labs Review Labs Reviewed  BASIC METABOLIC PANEL  CBC    Imaging Review Dg Chest 2 View  12/16/2014   CLINICAL DATA:  Left chest pain. Shortness of breath. Left arm pain and numbness. Symptoms started today.  EXAM: CHEST  2 VIEW  COMPARISON:  07/04/2014  FINDINGS: The heart size and mediastinal contours are within normal limits. Both lungs are clear. The visualized skeletal structures are unremarkable.  IMPRESSION: No active cardiopulmonary disease.   Electronically Signed   By: Van Clines M.D.   On: 12/16/2014 19:12   Ct Head Wo Contrast  12/16/2014   CLINICAL DATA:  Began wearing a nicotine patch yesterday, complaining of nausea, mouth pain, altered vision, diaphoresis, left arm and face numbness, left arm and leg tingling, as well as chest pain  EXAM: CT HEAD WITHOUT CONTRAST  TECHNIQUE: Contiguous axial images were obtained from the base of the skull through the vertex without intravenous contrast.  COMPARISON:  None.  FINDINGS: Diffuse atrophy. No evidence of hydrocephalus, mass, or infarct. No hemorrhage or extra-axial fluid. Calvarium intact. Incidental note of mucus retention cyst left maxillary sinus.  IMPRESSION: Generalized atrophy with no acute findings.   Electronically Signed   By: Skipper Cliche M.D.   On: 12/16/2014 19:45   Mr Brain Wo Contrast  12/17/2014   CLINICAL DATA:  Initial evaluation for acute stroke.  EXAM:  MRI HEAD WITHOUT CONTRAST  TECHNIQUE: Multiplanar, multiecho pulse sequences of the brain and surrounding structures were obtained without intravenous contrast.  COMPARISON:  Prior CT from earlier the same day.  FINDINGS: Mild age-related cerebral atrophy present. Mild T2/FLAIR hyperintensity within the periventricular and deep white matter both cerebral hemispheres noted, nonspecific, but most likely related to mild chronic small vessel ischemic disease.  No abnormal foci of restricted diffusion to suggest acute intracranial infarct. Gray-white matter differentiation maintained. Normal intravascular flow voids preserved. No acute or chronic intracranial hemorrhage.  No mass lesion, midline shift, or mass effect. No hydrocephalus. No extra-axial fluid collection.  Craniocervical junction within normal limits. Pituitary gland within normal limits. No acute abnormality about the orbits.  Air-fluid level present within the left maxillary sinus. Paranasal sinuses  are otherwise clear. Minimal opacity noted within the inferior mastoid 0 air cells bilaterally. Inner ear structures within normal limits.  Bone marrow signal intensity within normal limits. Scalp soft tissues unremarkable.  IMPRESSION: 1. No acute intracranial infarct or other abnormality identified. 2. Mild atrophy with chronic small vessel ischemic disease involving the periventricular white matter. 3. Left maxillary sinus disease.   Electronically Signed   By: Jeannine Boga M.D.   On: 12/17/2014 00:14   Ct Angio Chest Aorta W/cm &/or Wo/cm  12/16/2014   CLINICAL DATA:  Acute onset of dry heaving and mouth pain. Diaphoresis. Left arm and face numbness. Generalized chest pain. Initial encounter.  EXAM: CT ANGIOGRAPHY CHEST, ABDOMEN AND PELVIS  TECHNIQUE: Multidetector CT imaging through the chest, abdomen and pelvis was performed using the standard protocol during bolus administration of intravenous contrast. Multiplanar reconstructed images and  MIPs were obtained and reviewed to evaluate the vascular anatomy.  CONTRAST:  126mL OMNIPAQUE IOHEXOL 350 MG/ML SOLN  COMPARISON:  Chest radiograph performed earlier today at 7:06 p.m.  FINDINGS: CTA CHEST FINDINGS  There is no evidence of aortic dissection. There is no evidence of aneurysmal dilatation. Minimal calcific atherosclerotic disease is noted along the aortic arch and proximal subclavian arteries bilaterally. There is slight associated mural thrombus along the proximal left subclavian artery, without significant luminal narrowing.  There is no evidence of central pulmonary embolus.  Mild bibasilar atelectasis is noted. The lungs are otherwise clear. There is no evidence of significant focal consolidation, pleural effusion or pneumothorax. No masses are identified; no abnormal focal contrast enhancement is seen.  No mediastinal lymphadenopathy is seen. No pericardial effusion is identified. No axillary lymphadenopathy is seen. The visualized portions of the thyroid gland are unremarkable in appearance.  No acute osseous abnormalities are seen.  Review of the MIP images confirms the above findings.  CTA ABDOMEN AND PELVIS FINDINGS  There is no evidence of aortic dissection. There is no evidence of aneurysmal dilatation. Mild calcific atherosclerotic disease is noted along the abdominal aorta. The celiac trunk, superior mesenteric artery, bilateral renal arteries and inferior mesenteric artery remain fully patent.  The inferior vena cava is grossly unremarkable in appearance.  The liver and spleen are unremarkable in appearance. The gallbladder is within normal limits. The pancreas and adrenal glands are unremarkable.  Several small bilateral renal cysts are seen, measuring up to 8 mm in size. There is no evidence of hydronephrosis. No renal or ureteral stones are seen. No perinephric stranding is appreciated.  No free fluid is identified. The small bowel is unremarkable in appearance. The stomach is within  normal limits. No acute vascular abnormalities are seen.  The patient is status post appendectomy. Apparent mild wall thickening along the colon is thought reflect areas of decompression. The colon is grossly unremarkable in appearance.  The bladder is mildly distended and grossly unremarkable. The uterus is unremarkable in appearance. The ovaries are relatively symmetric. No suspicious adnexal masses are seen. No inguinal lymphadenopathy is seen.  No acute osseous abnormalities are identified.  Review of the MIP images confirms the above findings.  IMPRESSION: 1. No evidence of aortic dissection. No evidence of aneurysmal dilatation. Mild calcific atherosclerotic disease along the abdominal aorta, and minimally along the aortic arch and proximal subclavian arteries bilaterally. Slight mural thrombus along the proximal left subclavian artery, without significant luminal narrowing. 2. No evidence of central pulmonary embolus. 3. Mild bibasilar atelectasis noted.  Lungs otherwise clear. 4. Small bilateral renal cysts seen.   Electronically  Signed   By: Garald Balding M.D.   On: 12/16/2014 19:51   Ct Cta Abd/pel W/cm &/or W/o Cm  12/16/2014   CLINICAL DATA:  Acute onset of dry heaving and mouth pain. Diaphoresis. Left arm and face numbness. Generalized chest pain. Initial encounter.  EXAM: CT ANGIOGRAPHY CHEST, ABDOMEN AND PELVIS  TECHNIQUE: Multidetector CT imaging through the chest, abdomen and pelvis was performed using the standard protocol during bolus administration of intravenous contrast. Multiplanar reconstructed images and MIPs were obtained and reviewed to evaluate the vascular anatomy.  CONTRAST:  163mL OMNIPAQUE IOHEXOL 350 MG/ML SOLN  COMPARISON:  Chest radiograph performed earlier today at 7:06 p.m.  FINDINGS: CTA CHEST FINDINGS  There is no evidence of aortic dissection. There is no evidence of aneurysmal dilatation. Minimal calcific atherosclerotic disease is noted along the aortic arch and proximal  subclavian arteries bilaterally. There is slight associated mural thrombus along the proximal left subclavian artery, without significant luminal narrowing.  There is no evidence of central pulmonary embolus.  Mild bibasilar atelectasis is noted. The lungs are otherwise clear. There is no evidence of significant focal consolidation, pleural effusion or pneumothorax. No masses are identified; no abnormal focal contrast enhancement is seen.  No mediastinal lymphadenopathy is seen. No pericardial effusion is identified. No axillary lymphadenopathy is seen. The visualized portions of the thyroid gland are unremarkable in appearance.  No acute osseous abnormalities are seen.  Review of the MIP images confirms the above findings.  CTA ABDOMEN AND PELVIS FINDINGS  There is no evidence of aortic dissection. There is no evidence of aneurysmal dilatation. Mild calcific atherosclerotic disease is noted along the abdominal aorta. The celiac trunk, superior mesenteric artery, bilateral renal arteries and inferior mesenteric artery remain fully patent.  The inferior vena cava is grossly unremarkable in appearance.  The liver and spleen are unremarkable in appearance. The gallbladder is within normal limits. The pancreas and adrenal glands are unremarkable.  Several small bilateral renal cysts are seen, measuring up to 8 mm in size. There is no evidence of hydronephrosis. No renal or ureteral stones are seen. No perinephric stranding is appreciated.  No free fluid is identified. The small bowel is unremarkable in appearance. The stomach is within normal limits. No acute vascular abnormalities are seen.  The patient is status post appendectomy. Apparent mild wall thickening along the colon is thought reflect areas of decompression. The colon is grossly unremarkable in appearance.  The bladder is mildly distended and grossly unremarkable. The uterus is unremarkable in appearance. The ovaries are relatively symmetric. No suspicious  adnexal masses are seen. No inguinal lymphadenopathy is seen.  No acute osseous abnormalities are identified.  Review of the MIP images confirms the above findings.  IMPRESSION: 1. No evidence of aortic dissection. No evidence of aneurysmal dilatation. Mild calcific atherosclerotic disease along the abdominal aorta, and minimally along the aortic arch and proximal subclavian arteries bilaterally. Slight mural thrombus along the proximal left subclavian artery, without significant luminal narrowing. 2. No evidence of central pulmonary embolus. 3. Mild bibasilar atelectasis noted.  Lungs otherwise clear. 4. Small bilateral renal cysts seen.   Electronically Signed   By: Garald Balding M.D.   On: 12/16/2014 19:51   I have personally reviewed and evaluated these images and lab results as part of my medical decision-making.   EKG Interpretation   Date/Time:  Tuesday December 16 2014 18:32:55 EDT Ventricular Rate:  102 PR Interval:  96 QRS Duration: 76 QT Interval:  518 QTC Calculation: 675 R  Axis:   75 Text Interpretation:  Crtitcal Test -  Long QTc Sinus tachycardia with  short PR ST \\T \ T wave abnormality, consider inferior ischemia ST \\T \ T  wave abnormality, consider anterolateral ischemia Prolonged QT Abnormal  ECG Confirmed by Mingo Amber  MD, Jernard Reiber ([id number]) on 12/16/2014 6:55:55 PM      MDM   Final diagnoses:  Left sided numbness  Anxiety    54 year old female here with acute onset of nausea, vomiting, mouth pain, diaphoresis, left arm and left face and left leg tingling and weakness. She had some associated chest tightness which has improved. She has never had symptoms like this before. She has no history of stroke or heart problem, but she does have hypertension. Here vitals are stable. EKG shows a prolonged QTc. She does have altered L face, arm, leg sensation and mild L leg weakness. Patient does not meet a Code Stroke criteria since she is having a lot of chest pain and SOB with her  symptoms. Will scan her head just in case. CT angio normal. Given fluids since GFR low. Patient seen by Neuro, NIH 1, not a tPA candidate. Dr. Aram Beecham recommended MRI and discharge if negative. Patient told me this feels like a prior panic attack. Patient reports when shew as on xanax, she never had anything like this.  MR negative. Stable for discharge.  Evelina Bucy, MD 12/17/14 [phone]

## 2014-12-16 NOTE — ED Notes (Signed)
Pt states she started wearing a nicotine patch yesterday. States that 20 minutes ago she experienced dry heaving, mouth pain, "everything looking funny," sweating, left arm and face numb, left arm and leg tingling. Also c/o CP which has eased up. States that she has since removed the nicotine patch.

## 2014-12-16 NOTE — ED Notes (Signed)
Patient transported to MRI 

## 2014-12-16 NOTE — Consult Note (Signed)
Referring Physician: Dr Mingo Amber    Chief Complaint: left hemibody paresthesias  HPI:                                                                                                                                         Julie Hensley is an 54 y.o. female, right handed, with a past medical history significant for HTN, smoking, anxiety, and arthritis, comes in for evaluation of the above stated symptoms. Patient is here accompanied by family. She indicated that she was at work when suddenly started experiencing chest discomfort, became very sweaty, short of breath, developed a HA and her vision became very bright, dry heaving, mouth pain, "everything looking funny". While in the ED around 6:15 pm noticed a numb/tingly sensation of the left face-arm-leg-foot that has been constant ever since. Further, she said that the left leg feels " heavy". Denies vertigo, double vision, difficulty swallowing, slurred speech, face weakness, confusion, unsteadiness, or language impairment. Julie Hensley said that she began wearing a nicotine patch yesterday and wonders if her symptoms could be related to the patch. CT brain was personally reviewed and revealed no acute abnormality. Additionally, she had CTA chest-aorta as well as CT abdomen and pelvis that were unrevealing.   Date last known well: 12/16/14 Time last known well: 6:15 pm tPA Given: no, minimal sensory deficits NIHSS: 1 MRS: 0  Past Medical History  Diagnosis Date  . Hypertension     HBP at [employer] machine but never been to doctor for it.   . Complication of anesthesia   . PONV (postoperative nausea and vomiting)   . Arthritis   . Anxiety     Past Surgical History  Procedure Laterality Date  . Appendectomy    . Tonsillectomy    . Mandible fracture surgery      Family History  Problem Relation Age of Onset  . Anesthesia problems Neg Hx   . Hypertension Mother   . Heart disease Mother   . Hypertension Father   . Stroke Father   .  Hypertension Brother   . Diabetes Paternal Grandmother    Social History:  reports that she has been smoking Cigarettes.  She has a 20 pack-year smoking history. She does not have any smokeless tobacco history on file. She reports that she does not drink alcohol or use illicit drugs.  Family history: no MS, PD, epilepsy, brain tumor, or brain aneurysm  Allergies:  Allergies  Allergen Reactions  . Vicodin [Hydrocodone-Acetaminophen] Itching    Pt states did not bother her the last time she took it.    Medications:  I have reviewed the patient's current medications.  ROS:                                                                                                                                       History obtained from chart review and the patient  General ROS: negative for - chills, fatigue, fever, night sweats, weight gain or weight loss Psychological ROS: negative for - behavioral disorder, hallucinations, memory difficulties, or suicidal ideation Ophthalmic ROS: negative for - double vision, eye pain or loss of vision ENT ROS: negative for - epistaxis, nasal discharge, oral lesions, sore throat, tinnitus or vertigo Allergy and Immunology ROS: negative for - hives or itchy/watery eyes Hematological and Lymphatic ROS: negative for - bleeding problems, bruising or swollen lymph nodes Endocrine ROS: negative for - galactorrhea, hair pattern changes, polydipsia/polyuria or temperature intolerance Respiratory ROS: negative for - cough, hemoptysis, or wheezing Cardiovascular ROS: negative for -  dyspnea on exertion, edema or irregular heartbeat Gastrointestinal ROS: negative for - abdominal pain, diarrhea, hematemesis, nausea/vomiting or stool incontinence Genito-Urinary ROS: negative for - dysuria, hematuria, incontinence or urinary  frequency/urgency Musculoskeletal ROS: negative for - joint swelling or muscular weakness Neurological ROS: as noted in HPI Dermatological ROS: negative for rash and skin lesion changes   Physical exam:  Constitutional: well developed, pleasant female in no apparent distress. Blood pressure 132/75, pulse 86, temperature 97.6 F (36.4 C), temperature source Oral, resp. rate 22, SpO2 97 %. Eyes: no jaundice or exophthalmos.  Head: normocephalic. Neck: supple, no bruits, no JVD. Cardiac: no murmurs. Lungs: clear. Abdomen: soft, no tender, no mass. Extremities: no edema, clubbing, or cyanosis.  CV: pulses palpable throughout  Skin: no rash  Neurologic Examination:                                                                                                      General: Mental Status: Alert, oriented, thought content appropriate.  Speech fluent without evidence of aphasia.  Able to follow 3 step commands without difficulty. Cranial Nerves: II: Discs flat bilaterally; Visual fields grossly normal, pupils equal, round, reactive to light and accommodation III,IV, VI: ptosis not present, extra-ocular motions intact bilaterally V,VII: smile symmetric, facial light touch sensation decreased left face. VIII: hearing normal bilaterally IX,X: uvula rises symmetrically XI: bilateral shoulder shrug XII: midline tongue extension without atrophy or fasciculations Motor: Right : Upper extremity   5/5    Left:     Upper extremity   5/5  Lower extremity  5/5     Lower extremity   5/5 Tone and bulk:normal tone throughout; no atrophy noted Sensory: Pinprick and light touch slightly diminished left side. Deep Tendon Reflexes:  Right: Upper Extremity   Left: Upper extremity   biceps (C-5 to C-6) 2/4   biceps (C-5 to C-6) 2/4 tricep (C7) 2/4    triceps (C7) 2/4 Brachioradialis (C6) 2/4  Brachioradialis (C6) 2/4  Lower Extremity Lower Extremity  quadriceps (L-2 to L-4) 2/4   quadriceps (L-2 to  L-4) 2/4 Achilles (S1) 2/4   Achilles (S1) 2/4  Plantars: Right: downgoing   Left: downgoing Cerebellar: normal finger-to-nose,  normal heel-to-shin test Gait:  No ataxia    Results for orders placed or performed during the hospital encounter of 12/16/14 (from the past 48 hour(s))  Basic metabolic panel     Status: Abnormal   Collection Time: 12/16/14  6:50 PM  Result Value Ref Range   Sodium 138 135 - 145 mmol/L   Potassium 3.0 (L) 3.5 - 5.1 mmol/L   Chloride 100 (L) 101 - 111 mmol/L   CO2 22 22 - 32 mmol/L   Glucose, Bld 137 (H) 65 - 99 mg/dL   BUN 8 6 - 20 mg/dL   Creatinine, Ser 1.53 (H) 0.44 - 1.00 mg/dL   Calcium 10.8 (H) 8.9 - 10.3 mg/dL   GFR calc non Af Amer 37 (L) >60 mL/min   GFR calc Af Amer 43 (L) >60 mL/min    Comment: (NOTE) The eGFR has been calculated using the CKD EPI equation. This calculation has not been validated in all clinical situations. eGFR's persistently <60 mL/min signify possible Chronic Kidney Disease.    Anion gap 16 (H) 5 - 15  CBC     Status: Abnormal   Collection Time: 12/16/14  6:50 PM  Result Value Ref Range   WBC 10.0 4.0 - 10.5 K/uL   RBC 5.23 (H) 3.87 - 5.11 MIL/uL   Hemoglobin 15.9 (H) 12.0 - 15.0 g/dL   HCT 45.6 36.0 - 46.0 %   MCV 87.2 78.0 - 100.0 fL   MCH 30.4 26.0 - 34.0 pg   MCHC 34.9 30.0 - 36.0 g/dL   RDW 13.2 11.5 - 15.5 %   Platelets 298 150 - 400 K/uL  I-Stat Beta hCG blood, ED ([facility], [facility], [facility] only)     Status: Abnormal   Collection Time: 12/16/14  7:21 PM  Result Value Ref Range   I-stat hCG, quantitative 5.1 (H) <5 mIU/mL   Comment 3            Comment:   GEST. AGE      CONC.  (mIU/mL)   <=1 WEEK        5 - 50     2 WEEKS       50 - 500     3 WEEKS       100 - 10,000     4 WEEKS     1,000 - 30,000        FEMALE AND NON-PREGNANT FEMALE:     LESS THAN 5 mIU/mL   I-stat troponin, ED     Status: None   Collection Time: 12/16/14  7:21 PM  Result Value Ref Range   Troponin i, poc 0.00 0.00 - 0.08 ng/mL   Comment  3            Comment: Due to the release kinetics of cTnI, a negative result within the first hours of the onset of  symptoms does not rule out myocardial infarction with certainty. If myocardial infarction is still suspected, repeat the test at appropriate intervals.   I-stat chem 8, ed     Status: Abnormal   Collection Time: 12/16/14  7:23 PM  Result Value Ref Range   Sodium 139 135 - 145 mmol/L   Potassium 2.9 (L) 3.5 - 5.1 mmol/L   Chloride 101 101 - 111 mmol/L   BUN 10 6 - 20 mg/dL   Creatinine, Ser 1.50 (H) 0.44 - 1.00 mg/dL   Glucose, Bld 132 (H) 65 - 99 mg/dL   Calcium, Ion 1.21 1.12 - 1.23 mmol/L   TCO2 23 0 - 100 mmol/L   Hemoglobin 17.7 (H) 12.0 - 15.0 g/dL   HCT 52.0 (H) 36.0 - 46.0 %   Dg Chest 2 View  12/16/2014   CLINICAL DATA:  Left chest pain. Shortness of breath. Left arm pain and numbness. Symptoms started today.  EXAM: CHEST  2 VIEW  COMPARISON:  07/04/2014  FINDINGS: The heart size and mediastinal contours are within normal limits. Both lungs are clear. The visualized skeletal structures are unremarkable.  IMPRESSION: No active cardiopulmonary disease.   Electronically Signed   By: Van Clines M.D.   On: 12/16/2014 19:12   Ct Head Wo Contrast  12/16/2014   CLINICAL DATA:  Began wearing a nicotine patch yesterday, complaining of nausea, mouth pain, altered vision, diaphoresis, left arm and face numbness, left arm and leg tingling, as well as chest pain  EXAM: CT HEAD WITHOUT CONTRAST  TECHNIQUE: Contiguous axial images were obtained from the base of the skull through the vertex without intravenous contrast.  COMPARISON:  None.  FINDINGS: Diffuse atrophy. No evidence of hydrocephalus, mass, or infarct. No hemorrhage or extra-axial fluid. Calvarium intact. Incidental note of mucus retention cyst left maxillary sinus.  IMPRESSION: Generalized atrophy with no acute findings.   Electronically Signed   By: Skipper Cliche M.D.   On: 12/16/2014 19:45   Ct Angio Chest  Aorta W/cm &/or Wo/cm  12/16/2014   CLINICAL DATA:  Acute onset of dry heaving and mouth pain. Diaphoresis. Left arm and face numbness. Generalized chest pain. Initial encounter.  EXAM: CT ANGIOGRAPHY CHEST, ABDOMEN AND PELVIS  TECHNIQUE: Multidetector CT imaging through the chest, abdomen and pelvis was performed using the standard protocol during bolus administration of intravenous contrast. Multiplanar reconstructed images and MIPs were obtained and reviewed to evaluate the vascular anatomy.  CONTRAST:  172m OMNIPAQUE IOHEXOL 350 MG/ML SOLN  COMPARISON:  Chest radiograph performed earlier today at 7:06 p.m.  FINDINGS: CTA CHEST FINDINGS  There is no evidence of aortic dissection. There is no evidence of aneurysmal dilatation. Minimal calcific atherosclerotic disease is noted along the aortic arch and proximal subclavian arteries bilaterally. There is slight associated mural thrombus along the proximal left subclavian artery, without significant luminal narrowing.  There is no evidence of central pulmonary embolus.  Mild bibasilar atelectasis is noted. The lungs are otherwise clear. There is no evidence of significant focal consolidation, pleural effusion or pneumothorax. No masses are identified; no abnormal focal contrast enhancement is seen.  No mediastinal lymphadenopathy is seen. No pericardial effusion is identified. No axillary lymphadenopathy is seen. The visualized portions of the thyroid gland are unremarkable in appearance.  No acute osseous abnormalities are seen.  Review of the MIP images confirms the above findings.  CTA ABDOMEN AND PELVIS FINDINGS  There is no evidence of aortic dissection. There is no evidence of aneurysmal dilatation. Mild calcific atherosclerotic  disease is noted along the abdominal aorta. The celiac trunk, superior mesenteric artery, bilateral renal arteries and inferior mesenteric artery remain fully patent.  The inferior vena cava is grossly unremarkable in appearance.  The  liver and spleen are unremarkable in appearance. The gallbladder is within normal limits. The pancreas and adrenal glands are unremarkable.  Several small bilateral renal cysts are seen, measuring up to 8 mm in size. There is no evidence of hydronephrosis. No renal or ureteral stones are seen. No perinephric stranding is appreciated.  No free fluid is identified. The small bowel is unremarkable in appearance. The stomach is within normal limits. No acute vascular abnormalities are seen.  The patient is status post appendectomy. Apparent mild wall thickening along the colon is thought reflect areas of decompression. The colon is grossly unremarkable in appearance.  The bladder is mildly distended and grossly unremarkable. The uterus is unremarkable in appearance. The ovaries are relatively symmetric. No suspicious adnexal masses are seen. No inguinal lymphadenopathy is seen.  No acute osseous abnormalities are identified.  Review of the MIP images confirms the above findings.  IMPRESSION: 1. No evidence of aortic dissection. No evidence of aneurysmal dilatation. Mild calcific atherosclerotic disease along the abdominal aorta, and minimally along the aortic arch and proximal subclavian arteries bilaterally. Slight mural thrombus along the proximal left subclavian artery, without significant luminal narrowing. 2. No evidence of central pulmonary embolus. 3. Mild bibasilar atelectasis noted.  Lungs otherwise clear. 4. Small bilateral renal cysts seen.   Electronically Signed   By: Garald Balding M.D.   On: 12/16/2014 19:51   Ct Cta Abd/pel W/cm &/or W/o Cm  12/16/2014   CLINICAL DATA:  Acute onset of dry heaving and mouth pain. Diaphoresis. Left arm and face numbness. Generalized chest pain. Initial encounter.  EXAM: CT ANGIOGRAPHY CHEST, ABDOMEN AND PELVIS  TECHNIQUE: Multidetector CT imaging through the chest, abdomen and pelvis was performed using the standard protocol during bolus administration of intravenous  contrast. Multiplanar reconstructed images and MIPs were obtained and reviewed to evaluate the vascular anatomy.  CONTRAST:  162m OMNIPAQUE IOHEXOL 350 MG/ML SOLN  COMPARISON:  Chest radiograph performed earlier today at 7:06 p.m.  FINDINGS: CTA CHEST FINDINGS  There is no evidence of aortic dissection. There is no evidence of aneurysmal dilatation. Minimal calcific atherosclerotic disease is noted along the aortic arch and proximal subclavian arteries bilaterally. There is slight associated mural thrombus along the proximal left subclavian artery, without significant luminal narrowing.  There is no evidence of central pulmonary embolus.  Mild bibasilar atelectasis is noted. The lungs are otherwise clear. There is no evidence of significant focal consolidation, pleural effusion or pneumothorax. No masses are identified; no abnormal focal contrast enhancement is seen.  No mediastinal lymphadenopathy is seen. No pericardial effusion is identified. No axillary lymphadenopathy is seen. The visualized portions of the thyroid gland are unremarkable in appearance.  No acute osseous abnormalities are seen.  Review of the MIP images confirms the above findings.  CTA ABDOMEN AND PELVIS FINDINGS  There is no evidence of aortic dissection. There is no evidence of aneurysmal dilatation. Mild calcific atherosclerotic disease is noted along the abdominal aorta. The celiac trunk, superior mesenteric artery, bilateral renal arteries and inferior mesenteric artery remain fully patent.  The inferior vena cava is grossly unremarkable in appearance.  The liver and spleen are unremarkable in appearance. The gallbladder is within normal limits. The pancreas and adrenal glands are unremarkable.  Several small bilateral renal cysts are seen, measuring up  to 8 mm in size. There is no evidence of hydronephrosis. No renal or ureteral stones are seen. No perinephric stranding is appreciated.  No free fluid is identified. The small bowel is  unremarkable in appearance. The stomach is within normal limits. No acute vascular abnormalities are seen.  The patient is status post appendectomy. Apparent mild wall thickening along the colon is thought reflect areas of decompression. The colon is grossly unremarkable in appearance.  The bladder is mildly distended and grossly unremarkable. The uterus is unremarkable in appearance. The ovaries are relatively symmetric. No suspicious adnexal masses are seen. No inguinal lymphadenopathy is seen.  No acute osseous abnormalities are identified.  Review of the MIP images confirms the above findings.  IMPRESSION: 1. No evidence of aortic dissection. No evidence of aneurysmal dilatation. Mild calcific atherosclerotic disease along the abdominal aorta, and minimally along the aortic arch and proximal subclavian arteries bilaterally. Slight mural thrombus along the proximal left subclavian artery, without significant luminal narrowing. 2. No evidence of central pulmonary embolus. 3. Mild bibasilar atelectasis noted.  Lungs otherwise clear. 4. Small bilateral renal cysts seen.   Electronically Signed   By: Garald Balding M.D.   On: 12/16/2014 19:51     Assessment: 54 y.o. female presents to the ED with initial complains of chest pain, SOB, diaphoresis, visual blurriness, with subsequent development of left sided paresthesias and left leg heaviness. NIHSS 1 (only sensory left side) and CT brain without acute abnormality. Further, CTA chest-aorta and abdomen-pelvis unremarkable. She has risk factors for stroke, but focal sensory deficits in the context of chest pain/SOB/diaphoresis certainly atypical presentation for a right thalamic lacunar infarct. She is within the window for IV thrombolysis but has only minimal sensory deficit on exam and thus will not consider treatment with IV thrombolysis. Ordered MRI brain to exclude the possibility of a lacunar infarct left thalamus. No further stroke work up if MRI  negative. Will follow up after MRI.  Stroke Risk Factors - HTN, smoking   Dorian Pod, MD [facility] [phone]  12/16/2014, 8:27 PM

## 2014-12-17 NOTE — ED Provider Notes (Signed)
Pt with chest pain and L sided tingling. CT dissection ordered neg. Labs are all neg. Pt given fluids x 2 liters given slightly reduced GFR in the setting of contrast given. MRI head ordered - if neg, will be discharged. K replaced. Dr. Mingo Amber suspecting that pt has anxiety attack/panic attack.   Pt is back to baseline. No rx needed from the ER.  Filed Vitals:   12/16/14 2115  BP: 165/103  Pulse: 91  Temp:   Resp: 21     Tyrina Hines, MD 12/19/14 1342

## 2014-12-22 ENCOUNTER — Encounter: Payer: Self-pay | Admitting: Family Medicine

## 2014-12-22 ENCOUNTER — Ambulatory Visit (INDEPENDENT_AMBULATORY_CARE_PROVIDER_SITE_OTHER): Payer: Self-pay | Admitting: Family Medicine

## 2014-12-22 VITALS — BP 118/84 | HR 85 | Ht 63.0 in | Wt 130.0 lb

## 2014-12-22 DIAGNOSIS — M79646 Pain in unspecified finger(s): Secondary | ICD-10-CM

## 2014-12-22 DIAGNOSIS — M25549 Pain in joints of unspecified hand: Secondary | ICD-10-CM

## 2014-12-22 MED ORDER — GABAPENTIN 300 MG PO CAPS: ORAL_CAPSULE | ORAL | Status: DC

## 2014-12-22 NOTE — Assessment & Plan Note (Signed)
Multiple arthralgias including finger joints, knees, ankles. We are tapering her up on gabapentin did not have milligrams. She can follow-up with her PCP. If this has proven useful, they can continue taper up. Does not need to follow-up with sports medicine specifically for taper up of this medication. Patient may need chronic pain management and or consult with psychiatry as she seems to have quite a bit of agitation and anxiety.
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
## 2019-07-01 DIAGNOSIS — J449 Chronic obstructive pulmonary disease, unspecified: Secondary | ICD-10-CM | POA: Insufficient documentation

## 2019-09-14 ENCOUNTER — Ambulatory Visit: Payer: Self-pay | Attending: Internal Medicine

## 2019-12-02 ENCOUNTER — Other Ambulatory Visit: Payer: Self-pay

## 2019-12-02 ENCOUNTER — Ambulatory Visit
Admission: EM | Admit: 2019-12-02 | Discharge: 2019-12-02 | Disposition: A | Discharge status: Home / Self Care | Payer: Self-pay

## 2019-12-02 DIAGNOSIS — B370 Candidal stomatitis: Secondary | ICD-10-CM

## 2019-12-02 MED ORDER — MAGIC MOUTHWASH: 5.0000 mL | Freq: Three times a day (TID) | ORAL | 0 refills | Status: DC | PRN

## 2019-12-02 NOTE — ED Triage Notes (Signed)
Pt presents with dry mouth and burning sensation in the tongue x 2 month; diarrhea x 1 week. Pt states the burning sensation in the tongue is worse when eating.

## 2019-12-02 NOTE — Discharge Instructions (Signed)
Magic mouthwash prescribed.  Use as directed and to completion Maintain oral hygiene Rinse mouth with salt water or mouth wash after inhaler use to prevent oral thrush secondary to inhaler use Follow up with PCP as needed Return or go to the ED if you have any new or worsening symptoms such as fever, chills, nausea, vomiting, difficulty swallowing, fatigue, lymph node swelling, unintentional weight loss, excessive night sweats, etc..Marland Kitchen

## 2019-12-02 NOTE — ED Provider Notes (Signed)
[place]   [id number] 12/02/19 Arrival Time: 1125  CC: Mouth PAIN  SUBJECTIVE:  Julie Hensley is a 59 y.o. female who presents with mouth pain and burning x 2 months.  Denies a precipitating event or trauma.  Does admit to wearing dentures and using an inhaler. Localizes pain to symptoms to mouth.  Has tried "perigaurd" and another mouthwash without relief.  Worse with chewing.  Does not have a PCP.  Denies similar symptoms in the past.  Denies fever, chills, dysphagia, odynophagia, oral or neck swelling, nausea, vomiting, chest pain, SOB.    ROS: As per HPI.  All other pertinent ROS negative.     Past Medical History:  Diagnosis Date  . Anxiety   . Arthritis   . Complication of anesthesia   . Hypertension    HBP at [employer] machine but never been to doctor for it.   Marland Kitchen PONV (postoperative nausea and vomiting)    Past Surgical History:  Procedure Laterality Date  . APPENDECTOMY    . MANDIBLE FRACTURE SURGERY    . TONSILLECTOMY     No Known Allergies No current facility-administered medications on file prior to encounter.   Current Outpatient Medications on File Prior to Encounter  Medication Sig Dispense Refill  . Fluticasone-Salmeterol ([place]) Inhale into the lungs.    . hydrochlorothiazide (HYDRODIURIL) 25 MG tablet Take 25 mg by mouth daily.    Marland Kitchen acetaminophen (TYLENOL) 500 MG tablet Take 1,000-1,500 mg by mouth daily as needed for headache.    . albuterol (VENTOLIN HFA) 108 (90 Base) MCG/ACT inhaler Inhale 2 puffs into the lungs every 4 (four) hours as needed.    Marland Kitchen amitriptyline (ELAVIL) 100 MG tablet Take 100 mg by mouth at bedtime.    Marland Kitchen amLODipine (NORVASC) 10 MG tablet Take 1 tablet (10 mg total) by mouth daily. 30 tablet 3  . hydrochlorothiazide (HYDRODIURIL) 25 MG tablet Take 1 tablet (25 mg total) by mouth daily. (Patient not taking: Reported on 12/22/2014) 30 tablet 3  . [DISCONTINUED] gabapentin (NEURONTIN) 300 MG capsule Take three by mouth qhs  90 capsule 1  . [DISCONTINUED] omeprazole (PRILOSEC) 40 MG capsule Take 1 capsule (40 mg total) by mouth daily. 30 capsule 12   Social History   Socioeconomic History  . Marital status: Divorced    Spouse name: Not on file  . Number of children: Not on file  . Years of education: Not on file  . Highest education level: Not on file  Occupational History  . Not on file  Tobacco Use  . Smoking status: Current Every Day Smoker    Packs/day: 0.50    Years: 40.00    Pack years: 20.00    Types: Cigarettes  . Smokeless tobacco: Never Used  Substance and Sexual Activity  . Alcohol use: No    Alcohol/week: 0.0 standard drinks    Comment: has not had alcohol in a month.  Previously a case of beer daily  . Drug use: No  . Sexual activity: Yes    Birth control/protection: Post-menopausal    Comment: sex x1 in past year 2 weeks ago  Other Topics Concern  . Not on file  Social History Narrative  . Not on file   Social Determinants of Health   Financial Resource Strain:   . Difficulty of Paying Living Expenses:   Food Insecurity:   . Worried About Charity fundraiser in the Last Year:   . [place] in the  Last Year:   Transportation Needs:   . Film/video editor (Medical):   Marland Kitchen Lack of Transportation (Non-Medical):   Physical Activity:   . Days of Exercise per Week:   . Minutes of Exercise per Session:   Stress:   . Feeling of Stress :   Social Connections:   . Frequency of Communication with Friends and Family:   . Frequency of Social Gatherings with Friends and Family:   . Attends Religious Services:   . Active Member of Clubs or Organizations:   . Attends Archivist Meetings:   Marland Kitchen Marital Status:   Intimate Partner Violence:   . Fear of Current or Ex-Partner:   . Emotionally Abused:   Marland Kitchen Physically Abused:   . Sexually Abused:    Family History  Problem Relation Age of Onset  . Hypertension Mother   . Heart disease Mother   . Hypertension Father     . Stroke Father   . Hypertension Brother   . Diabetes Paternal Grandmother   . Anesthesia problems Neg Hx     OBJECTIVE:  Vitals:   12/02/19 1138  BP: 115/80  Pulse: 93  Resp: 18  Temp: 99.2 F (37.3 C)  TempSrc: Oral  SpO2: 95%    General appearance: alert; no distress HENT: normocephalic; atraumatic; EACs clear, TMs pearly gray; dentition: poor; dentures present upper gums; erythema and white plaque to soft palate, tongue appears erythematous, no white plaque Neck: supple without LAD Lungs: normal respirations; CTAB CV: RRR Skin: warm and dry Psychological: alert and cooperative; normal mood and affect  ASSESSMENT & PLAN:  1. Oral candidiasis     Meds ordered this encounter  Medications  . magic mouthwash SOLN    Sig: Take 5 mLs by mouth 3 (three) times daily as needed for mouth pain.    Dispense:  100 mL    Refill:  0    Order Specific Question:   Supervising Provider    Answer:   Raylene Everts [[id number]]    Magic mouthwash prescribed.  Use as directed and to completion Maintain oral hygiene Rinse mouth with salt water or mouth wash after inhaler use to prevent oral thrush secondary to inhaler use Follow up with PCP as needed Return or go to the ED if you have any new or worsening symptoms such as fever, chills, nausea, vomiting, difficulty swallowing, fatigue, lymph node swelling, unintentional weight loss, excessive night sweats, etc...    Reviewed expectations re: course of current medical issues. Questions answered. Outlined signs and symptoms indicating need for more acute intervention. Patient verbalized understanding. After Visit Summary given.   Lestine Box, PA-C 12/02/19 1156

## 2019-12-19 ENCOUNTER — Encounter: Payer: Self-pay | Admitting: Family Medicine

## 2019-12-19 ENCOUNTER — Ambulatory Visit (INDEPENDENT_AMBULATORY_CARE_PROVIDER_SITE_OTHER): Payer: Self-pay | Admitting: Family Medicine

## 2019-12-19 ENCOUNTER — Other Ambulatory Visit: Payer: Self-pay

## 2019-12-19 VITALS — BP 140/80 | HR 96 | Wt 158.2 lb

## 2019-12-19 DIAGNOSIS — Z7689 Persons encountering health services in other specified circumstances: Secondary | ICD-10-CM

## 2019-12-19 DIAGNOSIS — Z114 Encounter for screening for human immunodeficiency virus [HIV]: Secondary | ICD-10-CM

## 2019-12-19 DIAGNOSIS — Z1231 Encounter for screening mammogram for malignant neoplasm of breast: Secondary | ICD-10-CM

## 2019-12-19 DIAGNOSIS — Z720 Tobacco use: Secondary | ICD-10-CM

## 2019-12-19 DIAGNOSIS — I10 Essential (primary) hypertension: Secondary | ICD-10-CM

## 2019-12-19 DIAGNOSIS — Z598 Other problems related to housing and economic circumstances: Secondary | ICD-10-CM

## 2019-12-19 DIAGNOSIS — Z1211 Encounter for screening for malignant neoplasm of colon: Secondary | ICD-10-CM

## 2019-12-19 DIAGNOSIS — F411 Generalized anxiety disorder: Secondary | ICD-10-CM

## 2019-12-19 DIAGNOSIS — Z1159 Encounter for screening for other viral diseases: Secondary | ICD-10-CM

## 2019-12-19 DIAGNOSIS — B370 Candidal stomatitis: Secondary | ICD-10-CM

## 2019-12-19 DIAGNOSIS — Z5989 Other problems related to housing and economic circumstances: Secondary | ICD-10-CM

## 2019-12-19 DIAGNOSIS — J449 Chronic obstructive pulmonary disease, unspecified: Secondary | ICD-10-CM

## 2019-12-19 MED ORDER — NYSTATIN 100000 UNIT/ML MT SUSP: 400000.0000 [IU] | Freq: Four times a day (QID) | OROMUCOSAL | 1 refills | Status: DC

## 2019-12-19 MED ORDER — BREO ELLIPTA 100-25 MCG/INH IN AEPB: 1.0000 | INHALATION_SPRAY | Freq: Every day | RESPIRATORY_TRACT | 0 refills | Status: DC

## 2019-12-19 NOTE — Progress Notes (Signed)
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 She reports that her previous PCP told her that they would no longer see her for COPD and that she needs to go to a pulmonologist, which she cannot afford She had initially been having improvement in her breathing with Advair  Anxiety and Insomnia She is taking elavil at night which helps her sleep, but doesn't help with her anxiety   Hypertension Current regimen: HCTZ 25mg   No recent labs found in chart  Tobacco Abuse She has tried wellbutrin, patches, with no improvement Currently smoking 1/2 ppd, smoked 40 years  Mouth pain She was diagnosed with thrush 2/2 inhalers that she was using She used magic mouth wash with improvement, but she states that it didn't help Has been occurring about 1.5 months  No Insurance Patient does not currently have insurance, she has a job, but does not work 12 months out of the year, therefore she does not meet requirements for insurance from her job She has difficulty affording medications which has limited in the past She is unable to afford to go to a pulmonologist She also does not drive as she does not have a license and has to pay people to drive her places, does not feel comfortable taking taxis In order to come to doctors visits, she has to take off of work which limits her pay and therefore further limits her ability to pay for medications   PERTINENT  PMH / PSH: HTN, COPD,  Anxiety  OBJECTIVE:   BP 140/80   Pulse 96   Wt 158 lb 3.2 oz (71.8 kg)   SpO2 97%   BMI 28.02 kg/m    Physical Exam:  General: 59 y.o. female in NAD HEENT: no teeth, top dentures in place, when removed, hard palate of mouth erythematous with small white patches Cardio: RRR no m/r/g Lungs: Mild intermittent end expiratory wheezing with good air movement throughout all lung fields, no increased work of breathing on room air Skin: warm and dry Extremities: No edema   ASSESSMENT/PLAN:   Encounter to establish care Patient presents today to establish care.  Previously seen by [place] can be found in [place].  No recent labs on Care Everywhere.  Patient's healthcare has been affected by lack of insurance.  New patient packet reviewed.  Medical history, surgical, family, social history, medications, problem list updated and reviewed with patient.  Does not have health insurance Large factor to patient's health care and overall health.  CCM referral was placed to pharmacy and social work to help.  Patient given resources sheet and advised to ask for orange card information at front desk while leaving.  Also given sample for [facility] while here.  Patient encouraged to call for mammogram as they have ways to help people who do not have insurance afford it.  Essential hypertension Currently on hydrochlorothiazide 25 mg daily.  Slightly above goal.  We will go ahead and continue with this for now.  Patient would  like to wait on labs for hopeful assistance with cost.  Tobacco abuse Encourage cessation.  Patient has been on numerous things in the past.  Given quit line information.  Can also have a follow-up appointment regarding this.  CCM referral for pharmacy placed for this as well.  Anxiety state Currently on Elavil.  Helps with sleeping, but does not help with her anxiety.  Denies SI.  We will follow-up in 1 month for this.  Thrush Symptoms and exam consistent  with thrush.  Will treat with nystatin.  Also had pharmacy come to ensure that patient knows proper way to use inhalers.  Come back if no improvement.  Chronic obstructive pulmonary disease (HCC) Suspect that there is likely also an asthma component.  Therefore will give Breo sample, pharmacy came and educated patient on how to use this.  We will also continue to use albuterol as needed.  Tobacco cessation will help with patient's breathing as well, see tobacco abuse problem.  Could consider triple therapy if no improvement, but would hold off at this time especially since cost is such an issue for the patient.  Pulmonology is off the table at this time until she has assistance that she cannot afford it.     Julie Hensley, [place]

## 2019-12-19 NOTE — Patient Instructions (Addendum)
Thank you for coming to see me today. It was a pleasure. Today we talked about:   Ask for orange card information at the front desk.   I have also given you a resource sheet that has information on medication assistance and have reached out to our Chronic Care Management Team to help with cost.  Take Breo every today and albuterol only as needed  I have placed an order for your mammogram.  Please call [facility] at [phone] to schedule your appointment within one week.  Let them know that you don't have insurance.  Use nystatin 4 times a day for your mouth.  Call 1800-QUIT-NOW for help with stopping smoking. They can assist with free resources such as patches, check-in calls, and counseling.   Please follow-up with me in about 1 month.  If you have any questions or concerns, please do not hesitate to call the office at [phone].  Best,   [place] Constable, DO

## 2019-12-19 NOTE — Progress Notes (Signed)
Asked by Dr. Sandi Carne to educate patient on use of Breo Elipta. Patient educated on purpose, proper use and potential adverse effects of thrush.  Following instruction patient verbalized understanding of treatment plan.  Patient was able to perform first dose in office effectively.    We agreed that best dosing time for her would be once daily immediately before bed, them remove her denture, rinse completely with water and for the next few days rinse with her prescription Nystatin.

## 2019-12-20 ENCOUNTER — Telehealth: Payer: Self-pay | Admitting: *Deleted

## 2019-12-20 ENCOUNTER — Encounter: Payer: Self-pay | Admitting: Family Medicine

## 2019-12-20 DIAGNOSIS — B370 Candidal stomatitis: Secondary | ICD-10-CM | POA: Insufficient documentation

## 2019-12-20 DIAGNOSIS — Z7689 Persons encountering health services in other specified circumstances: Secondary | ICD-10-CM | POA: Insufficient documentation

## 2019-12-20 DIAGNOSIS — Z598 Other problems related to housing and economic circumstances: Secondary | ICD-10-CM | POA: Insufficient documentation

## 2019-12-20 DIAGNOSIS — Z5989 Other problems related to housing and economic circumstances: Secondary | ICD-10-CM | POA: Insufficient documentation

## 2019-12-20 DIAGNOSIS — J449 Chronic obstructive pulmonary disease, unspecified: Secondary | ICD-10-CM | POA: Insufficient documentation

## 2019-12-20 NOTE — Assessment & Plan Note (Signed)
Suspect that there is likely also an asthma component.  Therefore will give Breo sample, pharmacy came and educated patient on how to use this.  We will also continue to use albuterol as needed.  Tobacco cessation will help with patient's breathing as well, see tobacco abuse problem.  Could consider triple therapy if no improvement, but would hold off at this time especially since cost is such an issue for the patient.  Pulmonology is off the table at this time until she has assistance that she cannot afford it.

## 2019-12-20 NOTE — Assessment & Plan Note (Signed)
Currently on hydrochlorothiazide 25 mg daily.  Slightly above goal.  We will go ahead and continue with this for now.  Patient would like to wait on labs for hopeful assistance with cost.

## 2019-12-20 NOTE — Assessment & Plan Note (Signed)
Currently on Elavil.  Helps with sleeping, but does not help with her anxiety.  Denies SI.  We will follow-up in 1 month for this.

## 2019-12-20 NOTE — Assessment & Plan Note (Signed)
Large factor to patient's health care and overall health.  CCM referral was placed to pharmacy and social work to help.  Patient given resources sheet and advised to ask for orange card information at front desk while leaving.  Also given sample for [facility] while here.  Patient encouraged to call for mammogram as they have ways to help people who do not have insurance afford it.

## 2019-12-20 NOTE — Chronic Care Management (AMB) (Signed)
  Care Management   Note  12/20/2019 Name: Julie Hensley MRN: [id number] DOB: October 30, 1960  Julie Hensley is a 59 y.o. year old female who is a primary care patient of [place], Bernita Raisin, DO. I reached out to [name] by phone today in response to a referral sent by [name] health plan.    [name] was given information about care management services today including:  1. Care management services include personalized support from designated clinical staff supervised by her physician, including individualized plan of care and coordination with other care providers 2. 24/7 contact phone numbers for assistance for urgent and routine care needs. 3. The patient may stop care management services at any time by phone call to the office staff.  Patient agreed to services and verbal consent obtained.   Follow up plan: Telephone appointment with care management team member scheduled for: 12/25/2019  [facility]  [place], [place] [address] Direct Dial: [email or web address] Website: [email or web address]

## 2019-12-20 NOTE — Assessment & Plan Note (Signed)
Patient presents today to establish care.  Previously seen by [place] can be found in [place].  No recent labs on Care Everywhere.  Patient's healthcare has been affected by lack of insurance.  New patient packet reviewed.  Medical history, surgical, family, social history, medications, problem list updated and reviewed with patient.

## 2019-12-20 NOTE — Chronic Care Management (AMB) (Signed)
  Care Management   Outreach Note  12/20/2019 Name: Julie Hensley MRN: [id number] DOB: 1960/10/17  Julie Hensley is a 59 y.o. year old female who is a primary care patient of [place], Bernita Raisin, DO. I reached out to [name] by phone today in response to a referral sent by Julie Hensley PCP, Meccariello, Bernita Raisin, DO.    An unsuccessful telephone outreach was attempted today. The patient was referred to the case management team for assistance with care management and care coordination.   Follow Up Plan: A HIPPA compliant phone message was left for the patient providing contact information and requesting a return call. The care management team will reach out to the patient again over the next 7 days. If patient returns call to provider office, please advise to call [facility] at [phone].  [place], [place] [address] Direct Dial: [phone] [email or web address] Website: [email or web address]

## 2019-12-20 NOTE — Assessment & Plan Note (Signed)
Encourage cessation.  Patient has been on numerous things in the past.  Given quit line information.  Can also have a follow-up appointment regarding this.  CCM referral for pharmacy placed for this as well.

## 2019-12-20 NOTE — Assessment & Plan Note (Signed)
Symptoms and exam consistent with thrush.  Will treat with nystatin.  Also had pharmacy come to ensure that patient knows proper way to use inhalers.  Come back if no improvement.

## 2019-12-24 ENCOUNTER — Telehealth: Payer: Self-pay

## 2019-12-24 NOTE — Telephone Encounter (Signed)
Patient LVM on nurse line returning phone call to [facility]. Please return phone call to patient at [phone].   Talbot Grumbling, RN

## 2019-12-24 NOTE — Telephone Encounter (Signed)
Called patient and left a voicemail today at 10am.  Mailed a [place] application to home address and asked that patient return my call.

## 2019-12-24 NOTE — Telephone Encounter (Signed)
-----   Message from Leavy Cella, [place] sent at 12/19/2019  4:51 PM EDT ----- Please follow-up with this patient (uninsured) to determine if she is eligible for [facility] inhaler

## 2019-12-25 ENCOUNTER — Telehealth: Payer: Self-pay

## 2019-12-25 ENCOUNTER — Ambulatory Visit: Payer: Self-pay | Admitting: Licensed Clinical Social Worker

## 2019-12-25 ENCOUNTER — Other Ambulatory Visit: Payer: Self-pay

## 2019-12-25 DIAGNOSIS — R4589 Other symptoms and signs involving emotional state: Secondary | ICD-10-CM

## 2019-12-25 DIAGNOSIS — F411 Generalized anxiety disorder: Secondary | ICD-10-CM

## 2019-12-25 NOTE — Telephone Encounter (Signed)
Patient Julie Hensley on nurse line regarding Breo Inhaler. Patient reports that inhaler is not working and would like to know if she can be prescribed another inhaler.   Called patient to follow up. Patient reports persistent cough that starts when she gets out of the bed in the morning that lasts all day. Patient reports mild wheezing during coughing episodes. Reports continuing to use albuterol inhaler.   Patient works from 6:00- 2:30, requests returned phone call after 3 p.m. if possible.   Forwarding to PCP and Dr. Pervis Hocking, RN

## 2019-12-25 NOTE — Telephone Encounter (Signed)
Spoke with patient today, patient states that [facility] is not working for her, I advised her to follow-up with PCP (she stated she had an upcoming virtual visit).  I explained the patient assistance process to patient and she expressed an understanding.  I also explained that I would continue to assist her with her inhaler costs if her therapy was changed.  To avoid confusion I recommended that she fill her scripts with [facility] because she was concerned with cost (Breo and Nystatin susp), this way if her inhaler was changed or the office didn't have samples, she would pay no more than $10 with [place].

## 2019-12-25 NOTE — Chronic Care Management (AMB) (Signed)
Care Management   Clinical Social Work initial Note  12/25/2019 Name: Julie Hensley MRN: [id number] DOB: 06-02-1960 Julie Hensley is a 59 y.o. year old female who sees Meccariello, Bernita Raisin, DO for primary care. The Care Management team was consulted by PCP to assist the patient with Mental Health Counseling and Resources. Medication Management and Education.  LCSW reached out to [facility] today by phone to introduce self, assess needs and barriers to care.   Assessment: Patient is pleasant and engaged in conversation. Experiencing difficulty with getting medication as well as managing anxiety and symptoms of depression which seems to be exacerbated by no health insurance.    Pharmacy working with patient on medication needs.   History of counseling in the past as well as medication.  Patient reports has not been on a medication that manages her symptoms.     Recommendation:  Patient may benefit from, and is in agreement to medication management and ongoing counseling due to history of trauma,  Anxiety and symptoms of depress.  Patient may also benefit from [facility] card. Discussed resource options ( food stamps, Medicaid) and [employer] by front office staff. Plan:  1. Referral placed for psychiatry and counseling 2.   LCSW will collaborate with other team as needed 3.   LSCW will F/U with patient in 1 to 2 weeks   Review of patient status, including review of consultants reports, relevant laboratory and other test results, and collaboration with appropriate care team members and the patient's provider was performed as part of comprehensive patient evaluation and provision of chronic care management services.    Advance Directive Status:  Not addressed in this encounter SDOH (Social Determinants of Health) assessments performed: Yes: SDOH Interventions     Most Recent Value  SDOH Interventions  SDOH Interventions for the Following Domains Depression  Stress Interventions  Provide Counseling  Depression Interventions/Treatment  Referral to Psychiatry, Counseling      ; Goals Addressed            This Visit's Progress   . advance Directive       CARE PLAN ENTRY (see longitudinal plan of care for additional care plan information)  Current Barriers:  . Patient does not have an Forensic scientist . Patient acknowledges deficits, education and support in order to complete this document . Will revisit  Clinical Social Work Goal(s): Over the next 30 to 60 days,  . the patient will review and complete Advance Directive packet, have notarized and provide a copy to provider office . review mailed EMMI education on Advance Directive as evidenced by patient self report of review Interventions provided by LCSW: . Assessed understanding of Advance Directives . A voluntary discussion about advanced care planning including importance of advanced directives, healthcare proxy and living will was discussed with the patient.  . Will wait to Mailed the patient EMMI educational information on Advance Directives as well as an Advance Directive packet as she is not ready today Patient Self Care Activities:  . Is able to complete documentation independently . Able to identify [place] / [place] Initial goal documentation    . connect with mental health provider       [place] (see longitudinal plan of care for additional care plan information)  Current Barriers:  . Patient with history of trauma and Anxiety acknowledges deficits with connecting to mental health provider.  . Patient reports feeling stressed, over thinking things, feeling anxious  .  Patient is experiencing symptoms of depression and anxiety which seem to be exacerbated by life transitions, limited support system, caring for elderly mother and no health insurance to meet her medical needs.     . Patient needs Support, Education, and Care Coordination in order to meet  unmet mental health needs  Clinical Social Work Goal(s):  Marland Kitchen Over the next 30 days, patient will work with SW until connected for ongoing counseling.  . Patient will implement clinical interventions discussed today to decreases symptoms of anxiety and stress and increase knowledge and/or ability of: coping skills and stress reduction. Interventions:  . Assessed patient's  previous treatment, needs and barriers to care . Provided basic mental health support, education and interventions (relaxed breathing, psycho education on depression and anxiety) . Collaborated with pharmacy and PCP regarding patient needs . Discussed several options for long term counseling based on need and insurance. Assisted patient with narrowing the options down to [facility] as patient has no insurance ) . Referral placed via Epic to [facility] . Other interventions include: Motivational Interviewing ;Solution-Focused Strategies Patient Self Care Activities & Deficits:  . Patient is unable to independently navigate community resource options without care coordination support . Patient is able to implement clinical interventions discussed today  . Patient is motivated for treatment and waiting for a call to schedule her appointment. Initial goal documentation       Outpatient Encounter Medications as of 12/25/2019  Medication Sig  . acetaminophen (TYLENOL) 500 MG tablet Take 1,000-1,500 mg by mouth daily as needed for headache.  . albuterol (VENTOLIN HFA) 108 (90 Base) MCG/ACT inhaler Inhale 2 puffs into the lungs every 4 (four) hours as needed.  Marland Kitchen amitriptyline (ELAVIL) 100 MG tablet Take 100 mg by mouth at bedtime.  Marland Kitchen esomeprazole (NEXIUM) 20 MG capsule Take 20 mg by mouth daily at 12 noon.  . fluticasone furoate-vilanterol (BREO ELLIPTA) 100-25 MCG/INH AEPB Inhale 1 puff into the lungs at bedtime.  . hydrochlorothiazide (HYDRODIURIL) 25 MG tablet Take 25 mg by mouth  daily.  Marland Kitchen nystatin (MYCOSTATIN) 100000 UNIT/ML suspension Take 4 mLs (400,000 Units total) by mouth 4 (four) times daily. Swish in the mouth for as long as possible, then swallow  . [DISCONTINUED] gabapentin (NEURONTIN) 300 MG capsule Take three by mouth qhs  . [DISCONTINUED] omeprazole (PRILOSEC) 40 MG capsule Take 1 capsule (40 mg total) by mouth daily.   No facility-administered encounter medications on file as of 12/25/2019.     Casimer Lanius, [place] / [place]   [phone] 3:52 PM

## 2019-12-26 ENCOUNTER — Telehealth: Payer: Self-pay | Admitting: Family Medicine

## 2019-12-26 ENCOUNTER — Other Ambulatory Visit: Payer: Self-pay | Admitting: Family Medicine

## 2019-12-26 DIAGNOSIS — J449 Chronic obstructive pulmonary disease, unspecified: Secondary | ICD-10-CM

## 2019-12-26 MED ORDER — TRELEGY ELLIPTA 100-62.5-25 MCG/INH IN AEPB: 1.0000 | INHALATION_SPRAY | Freq: Every day | RESPIRATORY_TRACT | 3 refills | Status: DC

## 2019-12-26 MED FILL — !TRELEGY ELLIPTA 100-62.5-2: 100-62.5-25 | 30 days supply | Qty: 60 | Fill #0

## 2019-12-26 NOTE — Progress Notes (Signed)
Change from [facility] to Trelegy.  Sent to [facility].

## 2019-12-26 NOTE — Telephone Encounter (Signed)
Absolutely!  Thank you so much!

## 2019-12-26 NOTE — Telephone Encounter (Signed)
Thank you, Mel Almond.  I will notify pt when rx is ready.  Is it ok to change the quantity written from #28 to #60(stock contains 60 blisters)?

## 2019-12-26 NOTE — Telephone Encounter (Signed)
Attempted to call patient to address questions that she had regarding her medications as she has discussed with Casimer Lanius, LCSW.  No answer.  Left voicemail for patient to call back and let us know if she needs a refill.

## 2019-12-26 NOTE — Telephone Encounter (Signed)
Patient returns call to nurse line regarding changing inhalers. Patient apologizes for missing provider phone call and would like to discuss medication change with provider this afternoon.   To PCP  Talbot Grumbling, RN

## 2019-12-26 NOTE — Telephone Encounter (Signed)
Spoke with patient and Dr. Valentina Lucks.  Will change from [facility] to Trelegy.  Given thrush, will start at lower dose.  Will send Rx to [facility] and forward this message to [place] to coordinate [place].  Patient was also inquiring if Claiborne Billings had been contacted about Nystatin through [place], but unsure who told her that they would send her a message.    She would like to be notified when inhaler is ready for pickup.  Advised that if she runs out of inhaler in the meantime, to let us know and to not go without.  Could get her a sample if needed.

## 2019-12-26 NOTE — Telephone Encounter (Signed)
Patient LVM on nurse line again in regards to [facility] inhaler. Patient reports this inhaler does not work for her and she would like to be switched to something else. I attempted to call patient back to discuss, however I had to LVM. Patient prefers to be contacted after 3pm

## 2019-12-27 NOTE — Telephone Encounter (Signed)
Spoke to patient via phone today.  Trelegy is ready for pick up at [facility] at $0 to patient today and she will sign the [place] application for patient assistance when she comes to pick up.

## 2019-12-31 ENCOUNTER — Telehealth: Payer: Self-pay | Admitting: *Deleted

## 2019-12-31 NOTE — Telephone Encounter (Signed)
Received request from pharmacy for trelegy to be sent for qty#60. Donovan Gatchel Zimmerman Rumple, CMA

## 2020-01-01 ENCOUNTER — Other Ambulatory Visit: Payer: Self-pay | Admitting: Family Medicine

## 2020-01-01 DIAGNOSIS — J449 Chronic obstructive pulmonary disease, unspecified: Secondary | ICD-10-CM

## 2020-01-01 MED ORDER — TRELEGY ELLIPTA 100-62.5-25 MCG/INH IN AEPB: 1.0000 | INHALATION_SPRAY | Freq: Every day | RESPIRATORY_TRACT | 3 refills | Status: DC

## 2020-01-01 NOTE — Telephone Encounter (Signed)
Rx sent 

## 2020-01-02 ENCOUNTER — Ambulatory Visit: Payer: Self-pay | Admitting: Licensed Clinical Social Worker

## 2020-01-02 ENCOUNTER — Telehealth: Payer: Self-pay | Admitting: Licensed Clinical Social Worker

## 2020-01-02 DIAGNOSIS — R4589 Other symptoms and signs involving emotional state: Secondary | ICD-10-CM

## 2020-01-02 DIAGNOSIS — F439 Reaction to severe stress, unspecified: Secondary | ICD-10-CM

## 2020-01-02 NOTE — Chronic Care Management (AMB) (Signed)
Care Management   Clinical Social Work Follow Up   01/02/2020 Name: Julie Hensley MRN: [id number] DOB: December 01, 1960 Referred by: Cleophas Dunker, DO  Reason for referral : Care Coordination (mental health needs)  Julie Hensley is a 59 y.o. year old female who is a primary care patient of Cleophas Dunker, DO.  Reason for follow-up: assess for barriers and progress with care plan .   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 Patient is not making progress towards goal. States she has too much going on right now to follow up with the behavioral health appointment. Would like to address it again in a few weeks . Reminded patient of appointment with PCP 01/13/20.  Plan:  1. LCSW will F/U with patient in about 2 weeks 2.   Patient will call office if needed Advance Directive Status:not addressed during this encounter.  SDOH (Social Determinants of Health) assessments performed ; No needs identified   Goals Addressed            This Visit's Progress   . connect with mental health provider       [place] (see longitudinal plan of care for additional care plan information)  Current Barriers:  . Patient with history of trauma and Anxiety acknowledges deficits with connecting to mental health provider.  . Patient reports feeling stressed, over thinking things, feeling anxious  . Patient is experiencing symptoms of depression and anxiety which seem to be exacerbated by life transitions, limited support system, caring for elderly mother and no health insurance to meet her medical needs.     . Patient needs Support, Education, and Care Coordination in order to meet unmet mental health needs  . Patient contacted by [facility] however she has not returned the call, patient not sure if she wants to move forward with treatment Clinical Social Work  Goal(s):  Marland Kitchen Over the next 30 days, patient will work with SW until connected for ongoing counseling.  . Patient will implement clinical interventions discussed today to decreases symptoms of anxiety and stress and increase knowledge and/or ability of: coping skills and stress reduction. Interventions:  . Assessed patient's, needs reservations and barriers to care . Provided basic mental health support, education and interventions . Other interventions include: Motivational Interviewing ;Solution-Focused Strategies Patient Self Care Activities & Deficits:  . Patient is unable to independently navigate community resource options without care coordination support . Patient is able to implement clinical interventions discussed today  . Patient is not sure she wants treatment at this time Please see past updates related to this goal by clicking on the "Past Updates" button in the selected goal        Outpatient Encounter Medications as of 01/02/2020  Medication Sig  . acetaminophen (TYLENOL) 500 MG tablet Take 1,000-1,500 mg by mouth daily as needed for headache.  . albuterol (VENTOLIN HFA) 108 (90 Base) MCG/ACT inhaler Inhale 2 puffs into the lungs every 4 (four) hours as needed.  Marland Kitchen amitriptyline (ELAVIL) 100 MG tablet Take 100 mg by mouth at bedtime.  Marland Kitchen esomeprazole (NEXIUM) 20 MG capsule Take 20 mg by mouth daily at 12 noon.  . Fluticasone-Umeclidin-Vilant (TRELEGY ELLIPTA) 100-62.5-25 MCG/INH AEPB Inhale 1 puff into the lungs daily.  . hydrochlorothiazide (HYDRODIURIL) 25 MG tablet Take 25 mg by mouth daily.  Marland Kitchen nystatin (MYCOSTATIN) 100000 UNIT/ML suspension Take 4 mLs (400,000 Units  total) by mouth 4 (four) times daily. Swish in the mouth for as long as possible, then swallow  . [DISCONTINUED] gabapentin (NEURONTIN) 300 MG capsule Take three by mouth qhs  . [DISCONTINUED] omeprazole (PRILOSEC) 40 MG capsule Take 1 capsule (40 mg total) by mouth daily.   No facility-administered encounter  medications on file as of 01/02/2020.   Review of patient status, including review of consultants reports, relevant laboratory and other test results, and collaboration with appropriate care team members and the patient's provider was performed as part of comprehensive patient evaluation and provision of care management services.  Casimer Lanius, [place] / [place]   [phone] 3:02 PM

## 2020-01-02 NOTE — Chronic Care Management (AMB) (Signed)
    Clinical Social Work  Care Management Outreach   01/02/2020 Name: Julie Hensley MRN: [id number] DOB: 1961-04-26  Julie Hensley is a 59 y.o. year old female who is a primary care patient of Cleophas Dunker, DO .  The Care Management team was consulted for assistance with Mental Health Counseling and Resources.  F/U call to [name]  To assess needs and barriers with care plan goals.  [place] left voice message for patient to schedule appointment. No return call.  This outreach was also unsuccessful. A HIPPA compliant phone message was left for the patient providing contact information and requesting a return call.  Plan: LCSW will wait for return call.  If no return call will reach in 5 to 7 days  Review of patient status, including review of consultants reports, relevant laboratory and other test results, and collaboration with appropriate care team members and the patient's provider was performed as part of comprehensive patient evaluation and provision of care management services.    Casimer Lanius, [place] / [place]   [phone] 12:25 PM

## 2020-01-03 ENCOUNTER — Telehealth: Payer: Self-pay | Admitting: Pharmacist

## 2020-01-03 NOTE — Telephone Encounter (Signed)
Following my attempt to contact patient earlier in the AM, patient returned call and shared an update on her breathing / cough.   She states that she is using the inhaler and has cut down on smoking.  We discussed that quitting smoking remains the most important thing to do for her lungs.  She reports cutting back significantly.   Her cough continues to bother her significantly.  We discussed the impact on quitting smoking and bronchial secretion and potential increase short-term in mucous production and cough.  She expressed understanding that her cough may be worsened for a short period of time while she is quitting.   I encouraged her to continue work towards complete tobacco cessation. I encouraged her to continue using the Trelegy Ellipta and I encouraged her to keep her appointment with Dr. Sandi Carne in 10 days.   At this time I am not planning additional follow-up but I would be happy to do so if requested.

## 2020-01-13 ENCOUNTER — Ambulatory Visit (INDEPENDENT_AMBULATORY_CARE_PROVIDER_SITE_OTHER): Payer: Self-pay | Admitting: Family Medicine

## 2020-01-13 ENCOUNTER — Other Ambulatory Visit: Payer: Self-pay

## 2020-01-13 ENCOUNTER — Encounter: Payer: Self-pay | Admitting: Family Medicine

## 2020-01-13 VITALS — BP 122/80 | HR 73 | Ht 63.0 in | Wt 154.5 lb

## 2020-01-13 DIAGNOSIS — B370 Candidal stomatitis: Secondary | ICD-10-CM

## 2020-01-13 DIAGNOSIS — Z5989 Other problems related to housing and economic circumstances: Secondary | ICD-10-CM

## 2020-01-13 DIAGNOSIS — Z598 Other problems related to housing and economic circumstances: Secondary | ICD-10-CM

## 2020-01-13 DIAGNOSIS — J449 Chronic obstructive pulmonary disease, unspecified: Secondary | ICD-10-CM

## 2020-01-13 DIAGNOSIS — F411 Generalized anxiety disorder: Secondary | ICD-10-CM

## 2020-01-13 DIAGNOSIS — Z720 Tobacco use: Secondary | ICD-10-CM

## 2020-01-13 MED ORDER — HYDROXYZINE HCL 10 MG PO TABS: 10.0000 mg | ORAL_TABLET | Freq: Three times a day (TID) | ORAL | 0 refills | Status: DC | PRN

## 2020-01-13 NOTE — Assessment & Plan Note (Signed)
Continue to encourage cessation.  Patient has been speaking with Dr. Valentina Lucks.  She is very overwhelmed by all the people calling her at this time, therefore will not focus on this yet until patient is feeling somewhat better.

## 2020-01-13 NOTE — Assessment & Plan Note (Signed)
Lungs sound good today and overall having improvement in her symptoms.  Unclear what medication the patient is taking at present and that is working for her, therefore advised her to activate my chart and send message when she gets home.   For now, we will continue with albuterol as needed and what ever controller that she is using and having good results with.  Plan to refer to pulmonology after she has set up for orange card and medication assistance.

## 2020-01-13 NOTE — Assessment & Plan Note (Signed)
Continues to significantly limit patient's care.  Cannot obtain labs until patient receives insurance or assistance as she does not want to have a large bill.  She has been working with Casimer Lanius, LCSW on this, and brings in forms with her today.  Advised her to continue to work with the resources that we have available to her.  We will continue to try to obtain her medications to her little to no cost.  Hopefully she will have the orange card soon we will be able to refer her to specialists and help her other chronic medical issues.

## 2020-01-13 NOTE — Patient Instructions (Signed)
Thank you for coming to see me today. It was a pleasure. Today we talked about:   Please sign up for mychart.  Send me the name of your inhaler.  Use hydroxyzine up to three times daily for your anxiety.    I will send Neoma Laming a message about the paperwork.  Please follow-up with me in 1-2 months.  If you have any questions or concerns, please do not hesitate to call the office at [phone].  Best,   [place] Constable, DO

## 2020-01-13 NOTE — Assessment & Plan Note (Signed)
Overall, patient is having significant anxiety symptoms.  She is very overwhelmed by the amount of people who have been calling her to help her with medication assistance in obtaining resources to help pay for her medical bills.  Encourage patient to consider therapy, she states that she will think about it.  Overall, she wants to continue Elavil at her current dose, but wants to take something as needed as well.  Discussed with patient that hydroxyzine would be an option for this, she would like to start this.  Can use up to 3 times daily.  Return in 1 to 2 weeks.  We will continue to encourage therapy.  She may likely benefit from psychiatry referral after orange card is in place.

## 2020-01-13 NOTE — Progress Notes (Signed)
SUBJECTIVE:   CHIEF COMPLAINT / HPI:   COPD Patient started on Breo at last visit due to possible asthma component She continued to have symptoms and was started on Trelegy She has been receiving medications from community health and wellness due to cost issues She is not sure what controller she is using She went back to an old inhaler that she uses BID x 1 week She has had improvement since she went back to her older medication States that she was coughing a lot when she was on trelegy and breo  Tobacco Abuse She has been under a lot of stress  She was down to 3-4 cigarettes a day, but she is having increasing anxiety She was quarantined at work, her dog is passing, and she is very overwhelmed by all of the health problems she is having and how many people are calling her for things  Thrush She has significant thrush secondary to use of inhalers and dentures She was treated with nystatin and pharmacy educated on proper use of inhalers Starting to get better, but still having pain She is having burning in her tongue as well  Anxiety Patient reported anxiety and speaking with social worker Neoma Laming She was given resources for therapy and had been contacted by [employer], but does not want to do this right now Currently taking Elavil 300 mg nightly She states that they help her sleep, but doesn't help with anxiety during the day No SI  GAD 7 : Generalized Anxiety Score 01/13/2020  Nervous, Anxious, on Edge 3  Control/stop worrying 3  Worry too much - different things 3  Trouble relaxing 3  Restless 2  Easily annoyed or irritable 1  Afraid - awful might happen 1  Total GAD 7 Score 16    Depression screen [facility] 2/9 01/13/2020 12/20/2019 09/24/2014 06/10/2014  Decreased Interest 1 3 0 2  Down, Depressed, Hopeless 2 1 0 0  PHQ - 2 Score 3 4 0 2  Altered sleeping 1 3 - 3  Tired, decreased energy 1 3 - 3  Change in appetite 0 1 - 0  Feeling bad or failure  about yourself  0 0 - 3  Trouble concentrating 2 1 - 2  Moving slowly or fidgety/restless 0 2 - 2  Suicidal thoughts 0 0 - 0  PHQ-9 Score 7 14 - 15  Difficult doing work/chores Not difficult at all Extremely dIfficult - -     Lack of insurance Has been working with Neoma Laming Filling out forms for orange card  PERTINENT  PMH / PSH: HTN, COPD, reflux, thrush, insomnia, tobacco abuse, anxiety state  OBJECTIVE:   BP 122/80    Pulse 73    Ht 5\' 3"  (1.6 m)    Wt 154 lb 8 oz (70.1 kg)    SpO2 98%    BMI 27.37 kg/m    Physical Exam:  General: 59 y.o. female in NAD Cardio: RRR no m/r/g Lungs: CTAB, no wheezing, no rhonchi, no crackles, no IWOB on RA Skin: warm and dry Extremities: No edema Psych: anxious appearing, moving legs back and forth during examination, no SI   ASSESSMENT/PLAN:   Chronic obstructive pulmonary disease (HCC) Lungs sound good today and overall having improvement in her symptoms.  Unclear what medication the patient is taking at present and that is working for her, therefore advised her to activate my chart and send message when she gets home.   For now, we will continue with  albuterol as needed and what ever controller that she is using and having good results with.  Plan to refer to pulmonology after she has set up for orange card and medication assistance.  Thrush Having improvement in her symptoms with nystatin and proper use of inhalers.  It is likely also that some of her symptoms are coming from ill fitting dentures, but patient cannot afford to go to the dentist at this time.  We will continue to address.  Tobacco abuse Continue to encourage cessation.  Patient has been speaking with Dr. Valentina Lucks.  She is very overwhelmed by all the people calling her at this time, therefore will not focus on this yet until patient is feeling somewhat better.  Anxiety state Overall, patient is having significant anxiety symptoms.  She is very overwhelmed by the amount of people  who have been calling her to help her with medication assistance in obtaining resources to help pay for her medical bills.  Encourage patient to consider therapy, she states that she will think about it.  Overall, she wants to continue Elavil at her current dose, but wants to take something as needed as well.  Discussed with patient that hydroxyzine would be an option for this, she would like to start this.  Can use up to 3 times daily.  Return in 1 to 2 weeks.  We will continue to encourage therapy.  She may likely benefit from psychiatry referral after orange card is in place.  Does not have health insurance Continues to significantly limit patient's care.  Cannot obtain labs until patient receives insurance or assistance as she does not want to have a large bill.  She has been working with Casimer Lanius, LCSW on this, and brings in forms with her today.  Advised her to continue to work with the resources that we have available to her.  We will continue to try to obtain her medications to her little to no cost.  Hopefully she will have the orange card soon we will be able to refer her to specialists and help her other chronic medical issues.     Cleophas Dunker, [place]

## 2020-01-13 NOTE — Assessment & Plan Note (Signed)
Having improvement in her symptoms with nystatin and proper use of inhalers.  It is likely also that some of her symptoms are coming from ill fitting dentures, but patient cannot afford to go to the dentist at this time.  We will continue to address.

## 2020-01-16 ENCOUNTER — Ambulatory Visit: Payer: Self-pay | Admitting: Licensed Clinical Social Worker

## 2020-01-16 DIAGNOSIS — Z5989 Other problems related to housing and economic circumstances: Secondary | ICD-10-CM

## 2020-01-16 NOTE — Chronic Care Management (AMB) (Signed)
   Social Work  Care Management Collaboration 01/16/2020 Name: Omaira Mellen MRN: [id number] DOB: 10-13-1960 [place] is a 59 y.o. year old female who sees Meccariello, Bernita Raisin, DO for primary care.  Patient left Orange card application for LCSW. Called patient and left message that Application needs to go to [employer] community care network.  LCSW can faxed application and they will reach out to patient to provide needed information.  Called patient back and left message providing update/  Intervention: Patient was not interviewed or contacted during this encounter.  LCSW collaborated with [employer]. Plan: LCSW will wait for return call.  Will F/U as needed  Review of patient status, including review of consultants reports, relevant laboratory and other test results, and collaboration with appropriate care team members and the patient's provider was performed as part of comprehensive patient evaluation and provision of chronic care management services.    Casimer Lanius, [place] / [place]   [phone] 4:26 PM

## 2020-01-19 ENCOUNTER — Encounter: Payer: Self-pay | Admitting: Family Medicine

## 2020-01-20 ENCOUNTER — Encounter: Payer: Self-pay | Admitting: Family Medicine

## 2020-01-23 ENCOUNTER — Encounter: Payer: Self-pay | Admitting: Family Medicine

## 2020-01-24 ENCOUNTER — Encounter: Payer: Self-pay | Admitting: Family Medicine

## 2020-02-03 ENCOUNTER — Encounter: Payer: Self-pay | Admitting: Family Medicine

## 2020-02-04 ENCOUNTER — Other Ambulatory Visit: Payer: Self-pay | Admitting: Family Medicine

## 2020-02-04 DIAGNOSIS — J449 Chronic obstructive pulmonary disease, unspecified: Secondary | ICD-10-CM

## 2020-02-04 MED ORDER — ALBUTEROL SULFATE HFA 108 (90 BASE) MCG/ACT IN AERS: 2.0000 | INHALATION_SPRAY | RESPIRATORY_TRACT | 2 refills | Status: DC | PRN

## 2020-02-04 MED FILL — ALBUTEROL SULFATE HFA 108 (: 108 (90 BAS | 16 days supply | Qty: 18 | Fill #0

## 2020-02-04 NOTE — Progress Notes (Signed)
Albuterol refill

## 2020-02-12 ENCOUNTER — Other Ambulatory Visit: Payer: Self-pay

## 2020-02-12 ENCOUNTER — Other Ambulatory Visit: Payer: Self-pay | Admitting: Family Medicine

## 2020-02-12 ENCOUNTER — Encounter: Payer: Self-pay | Admitting: Family Medicine

## 2020-02-12 ENCOUNTER — Ambulatory Visit (INDEPENDENT_AMBULATORY_CARE_PROVIDER_SITE_OTHER): Payer: Self-pay | Admitting: Family Medicine

## 2020-02-12 VITALS — BP 130/72 | HR 84 | Ht 63.0 in | Wt 158.0 lb

## 2020-02-12 DIAGNOSIS — F411 Generalized anxiety disorder: Secondary | ICD-10-CM

## 2020-02-12 DIAGNOSIS — J449 Chronic obstructive pulmonary disease, unspecified: Secondary | ICD-10-CM

## 2020-02-12 DIAGNOSIS — Z5989 Other problems related to housing and economic circumstances: Secondary | ICD-10-CM

## 2020-02-12 MED ORDER — FLUTICASONE-SALMETEROL 100-50 MCG/DOSE IN AEPB: 1.0000 | INHALATION_SPRAY | Freq: Two times a day (BID) | RESPIRATORY_TRACT | 3 refills | Status: DC

## 2020-02-12 MED ORDER — BUSPIRONE HCL 5 MG PO TABS: 5.0000 mg | ORAL_TABLET | Freq: Three times a day (TID) | ORAL | 0 refills | Status: DC

## 2020-02-12 MED FILL — FLUTICASONE-SALMETEROL 100-: 100-50 | 30 days supply | Qty: 60 | Fill #0

## 2020-02-12 NOTE — Assessment & Plan Note (Signed)
Continues to impact all aspects of patient's health and care that can be provided.  We will get a hold off on obtaining labs, as she brings with her paperwork that will be needed to get orange card.  When she gets this, can hopefully order labs and refer her to pulmonologist.

## 2020-02-12 NOTE — Assessment & Plan Note (Signed)
Discussed with patient ultimately titrating off of Elavil 300 mg daily, she is not interested in that at this time given her significant anxiety, agree that this is fine, but in the future she will likely have anticholinergic effects and will need to taper down.  She was not having improvement with hydroxyzine, therefore will discontinue.  Can start her on BuSpar 5 mg 3 times daily, could titrate up to 10 mg if needed.  Also reiterated that she should see a therapist and encouraged her to do so.  She stated that she would be open to this.  She was again given resources for therapy.

## 2020-02-12 NOTE — Assessment & Plan Note (Signed)
Discussed with Dr. Valentina Lucks given that she has not had improvement with Trelegy and finds Wixela to be better for her.  Unclear medical reason as to why this is occurring, both are also dry powder.  She has already been educated on inhaler use method.  Given that she does better with [facility], will go ahead and send a prescription for this.  [facility], so we will send to community health and wellness and see if this will be more affordable for her.  Eventually, would like to get her into a pulmonologist.  We will also continue to encourage smoking cessation.  This will be complicated by her anxiety, hopefully this can get better control as well.

## 2020-02-12 NOTE — Progress Notes (Signed)
SUBJECTIVE:   CHIEF COMPLAINT / HPI:   Anxiety Currently taking Elavil 300 mg nightly and it is helping her sleep at night Has been taking hydroxyzine as needed for anxiety which didn't help at all Feels very anxious Her memory has not been good with this  GAD 7 : Generalized Anxiety Score 02/12/2020 01/13/2020  Nervous, Anxious, on Edge 3 3  Control/stop worrying 3 3  Worry too much - different things 3 3  Trouble relaxing 3 3  Restless 3 2  Easily annoyed or irritable 1 1  Afraid - awful might happen 0 1  Total GAD 7 Score 16 16     Office Visit from 02/12/2020 in [place]  PHQ-9 Total Score 11      COPD Has been taking Trelegy once daily States that she continues to not do well with this and requires albuterol multiple times daily States that she is better controlled when she uses Wixela twice daily She wants to know if she can use Trelegy twice daily and why it does not work well for her if it is supposed to be the best medication for COPD She continues to state that she has worsening of her symptoms when she is at work using bleach She continues to smoke  Lack of insurance She brings with her forms to fax for orange card assistance and would like assistance with this  PERTINENT  PMH / PSH: HTN, COPD, tobacco abuse  OBJECTIVE:   BP 130/72   Pulse 84   Ht 5\' 3"  (1.6 m)   Wt 158 lb (71.7 kg)   SpO2 96%   BMI 27.99 kg/m    Physical Exam:  General: 59 y.o. female in NAD Lungs: Breathing comfortably on RA Skin: warm and dry Extremities: Ambulating without difficulty Psych: anxious appearing, intermittently tearful when talking about her anxiety   ASSESSMENT/PLAN:   Anxiety state Discussed with patient ultimately titrating off of Elavil 300 mg daily, she is not interested in that at this time given her significant anxiety, agree that this is fine, but in the future she will likely have anticholinergic effects and will need to  taper down.  She was not having improvement with hydroxyzine, therefore will discontinue.  Can start her on BuSpar 5 mg 3 times daily, could titrate up to 10 mg if needed.  Also reiterated that she should see a therapist and encouraged her to do so.  She stated that she would be open to this.  She was again given resources for therapy.  Chronic obstructive pulmonary disease (HCC) Discussed with Dr. Valentina Lucks given that she has not had improvement with Trelegy and finds Wixela to be better for her.  Unclear medical reason as to why this is occurring, both are also dry powder.  She has already been educated on inhaler use method.  Given that she does better with [facility], will go ahead and send a prescription for this.  [facility], so we will send to community health and wellness and see if this will be more affordable for her.  Eventually, would like to get her into a pulmonologist.  We will also continue to encourage smoking cessation.  This will be complicated by her anxiety, hopefully this can get better control as well.  Does not have health insurance Continues to impact all aspects of patient's health and care that can be provided.  We will get a hold off on obtaining labs, as she  brings with her paperwork that will be needed to get orange card.  When she gets this, can hopefully order labs and refer her to pulmonologist.     Cleophas Dunker, [place]

## 2020-02-12 NOTE — Patient Instructions (Addendum)
Thank you for coming to see me today. It was a pleasure. Today we talked about:   Your anxiety: Please look at the list of therapist below and look into this.  I would recommend giving [facility] behavioral health call first.  He can continue to take Elavil, in the future we would like to decrease this.  For now, you can take BuSpar 3 times daily.  We can increase this if we need to in the future.  Your breathing: You can stop taking Trelegy and we can go back to what you are taking previously, Wixela.  You can use this twice daily.  [facility], therefore we will need to send to community health and wellness.  Please follow-up with me in 1 month.  If you have any questions or concerns, please do not hesitate to call the office at [phone].  Best,   [place] Constable, DO   Therapy and Counseling Resources Most providers on this list will take Medicaid. Patients with commercial insurance or Medicare should contact their insurance company to get a list of in network providers.  BestDay:Psychiatry and Counseling [address] [facility] [place]. [place], [place] [address] [place]  [address] [place], [place] [address]      [place] [address]  [place], [place] [address] [phone]  [place] [address] [place]  [place], [place] [address]       [phone]      Jinny Blossom [facility] [address] [place], [place]  Family Solutions:  [place]. [place] [phone]  Jour[facility] Counseling:  [place] [address] [phone]  [facility] (under & uninsured) [address] [place] [phone]    [email or web address]    [place] [address] . [place]    [phone]  [facility] [place]     Phone:  [phone]     [place]  [place] [address]      [place], [place] ext [place]: [place], [place], [place]   [place] ([place] therapist) [email or web address]  [place] [address]   [place] [address]    [phone]    [facility]   [address],  [place], [place]   [place]  [place]  [facility]  [address] [place], [place]        [phone]  Open Access/Walk In Clinic under & uninsured  [facility]  [address] [place], [place] Crisis [phone]  Family Service of the [place],  ([place])   [place], [place]: [phone]) 8:30 - 12; 1 - 2:30  Family Service of the [place],  [place], [place]    [phone]):8:30 - 12; 2 - 3PM  [facility] [employer],  [address],  [place]; [phone]):   Mon - Fri 8 AM - 5 PM  [facility] [place]  MWF 12:30 to 3:00 or call to schedule an appointment  [phone]  Specific Provider options Psychology Today  [email or web address] 1. click on find a therapist  2. enter your zip code 3. left side and select or tailor a therapist for your specific need.   [facility] Provider Directory [email or web address]  (Medicaid)   Follow all drop down to find a provider  [place] [phone] or [email or web address] [address], [place], [place] Recovery support and educational   24- Hour Availability:  .  [facility]  . [place], [place] Crisis [phone]  . Family Service  of the [employer] [phone]  [facility] Crisis Service  [phone]   . [place]  [phone] (after hours)  . Therapeutic Alternative/Mobile Crisis   [phone]  . [place] National Suicide Hotline  [phone] ([place])  . Call 911 or go to emergency room  . [employer]  [phone]);  [place] and [employer]   . [facility] ACCESS  [phone]); [place], [place], [place], [place], [place], [place], [place]

## 2020-02-21 ENCOUNTER — Encounter: Payer: Self-pay | Admitting: Family Medicine

## 2020-03-08 ENCOUNTER — Encounter: Payer: Self-pay | Admitting: Family Medicine

## 2020-03-09 ENCOUNTER — Other Ambulatory Visit: Payer: Self-pay | Admitting: Family Medicine

## 2020-03-09 ENCOUNTER — Telehealth: Payer: Self-pay

## 2020-03-09 DIAGNOSIS — F411 Generalized anxiety disorder: Secondary | ICD-10-CM

## 2020-03-09 DIAGNOSIS — J449 Chronic obstructive pulmonary disease, unspecified: Secondary | ICD-10-CM

## 2020-03-09 MED ORDER — BUSPIRONE HCL 10 MG PO TABS: 10.0000 mg | ORAL_TABLET | Freq: Three times a day (TID) | ORAL | 1 refills | Status: DC

## 2020-03-09 MED ORDER — ALBUTEROL SULFATE HFA 108 (90 BASE) MCG/ACT IN AERS: 2.0000 | INHALATION_SPRAY | RESPIRATORY_TRACT | 2 refills | Status: DC | PRN

## 2020-03-09 MED ORDER — FLUTICASONE-SALMETEROL 100-50 MCG/DOSE IN AEPB: 1.0000 | INHALATION_SPRAY | Freq: Two times a day (BID) | RESPIRATORY_TRACT | 3 refills | Status: DC

## 2020-03-09 MED FILL — FLUTICASONE-SALMETEROL 100-: 100-50 | 30 days supply | Qty: 60 | Fill #0

## 2020-03-09 MED FILL — ALBUTEROL SULFATE HFA 108 (: 108 (90 BAS | 16 days supply | Qty: 18 | Fill #0

## 2020-03-09 NOTE — Telephone Encounter (Signed)
Please disregard previous request.  I see the notes where she was changed back to Advair.

## 2020-03-09 NOTE — Telephone Encounter (Signed)
Patient is receiving patient assistance for Trelegy 100/62.5/25mcg.  Has patient been changed to the generic Advair 100/50?  We received a prescription for the Advair today-it looks like patient requested this refilled, but also show that she is receiving the Trelegy.

## 2020-03-12 ENCOUNTER — Telehealth: Payer: Self-pay | Admitting: *Deleted

## 2020-03-12 ENCOUNTER — Other Ambulatory Visit: Payer: Self-pay | Admitting: Family Medicine

## 2020-03-12 DIAGNOSIS — F411 Generalized anxiety disorder: Secondary | ICD-10-CM

## 2020-03-12 MED ORDER — BUSPIRONE HCL 10 MG PO TABS: 10.0000 mg | ORAL_TABLET | Freq: Three times a day (TID) | ORAL | 1 refills | Status: DC

## 2020-03-12 NOTE — Progress Notes (Signed)
Rx for 90 day supply requested

## 2020-03-12 NOTE — Telephone Encounter (Signed)
Received fax requesting a 90 day supply on pts buspirone Rx.[place] Arianah Torgeson Zimmerman Rumple, CMA

## 2020-03-12 NOTE — Telephone Encounter (Signed)
Rx sent 

## 2020-04-01 ENCOUNTER — Encounter: Payer: Self-pay | Admitting: Family Medicine

## 2020-04-01 ENCOUNTER — Other Ambulatory Visit: Payer: Self-pay | Admitting: Family Medicine

## 2020-04-01 DIAGNOSIS — F411 Generalized anxiety disorder: Secondary | ICD-10-CM

## 2020-04-01 DIAGNOSIS — J449 Chronic obstructive pulmonary disease, unspecified: Secondary | ICD-10-CM

## 2020-04-01 MED ORDER — ALBUTEROL SULFATE HFA 108 (90 BASE) MCG/ACT IN AERS: 2.0000 | INHALATION_SPRAY | RESPIRATORY_TRACT | 2 refills | Status: DC | PRN

## 2020-04-01 MED ORDER — FLUTICASONE-SALMETEROL 100-50 MCG/DOSE IN AEPB: 1.0000 | INHALATION_SPRAY | Freq: Two times a day (BID) | RESPIRATORY_TRACT | 3 refills | Status: DC

## 2020-04-01 MED ORDER — AMITRIPTYLINE HCL 150 MG PO TABS: 300.0000 mg | ORAL_TABLET | Freq: Every day | ORAL | 2 refills | Status: DC

## 2020-04-03 ENCOUNTER — Telehealth: Payer: Self-pay

## 2020-04-03 ENCOUNTER — Other Ambulatory Visit: Payer: Self-pay | Admitting: Family Medicine

## 2020-04-03 DIAGNOSIS — J449 Chronic obstructive pulmonary disease, unspecified: Secondary | ICD-10-CM

## 2020-04-03 MED ORDER — FLUTICASONE-SALMETEROL 100-50 MCG/DOSE IN AEPB: 1.0000 | INHALATION_SPRAY | Freq: Two times a day (BID) | RESPIRATORY_TRACT | 3 refills | Status: DC

## 2020-04-03 NOTE — Telephone Encounter (Signed)
Noted  

## 2020-04-03 NOTE — Telephone Encounter (Signed)
Spoke to patient regarding her Advair patient assistance enrollment today.  Pt is aware that the Advair is being shipped to her home via the [place] Patient Assistance Program, it will ship out today (04/03/20) or Monday (04/06/20) per pharmacist and patient should receive in 3-7 business days.  Discontinued Trelegy with pharmacy as Advair is replacing Trelegy.

## 2020-04-27 ENCOUNTER — Encounter: Payer: Self-pay | Admitting: Family Medicine

## 2020-04-28 ENCOUNTER — Other Ambulatory Visit: Payer: Self-pay | Admitting: Family Medicine

## 2020-04-28 DIAGNOSIS — J449 Chronic obstructive pulmonary disease, unspecified: Secondary | ICD-10-CM

## 2020-04-28 MED ORDER — ALBUTEROL SULFATE HFA 108 (90 BASE) MCG/ACT IN AERS: 2.0000 | INHALATION_SPRAY | RESPIRATORY_TRACT | 2 refills | Status: DC | PRN

## 2020-04-28 MED FILL — ALBUTEROL SULFATE HFA 108 (: 108 (90 BAS | 16 days supply | Qty: 18 | Fill #0

## 2020-05-11 ENCOUNTER — Encounter: Payer: Self-pay | Admitting: Family Medicine

## 2020-06-18 ENCOUNTER — Ambulatory Visit (INDEPENDENT_AMBULATORY_CARE_PROVIDER_SITE_OTHER): Payer: Self-pay | Admitting: Family Medicine

## 2020-06-18 VITALS — BP 148/77 | HR 87 | Temp 99.9°F | Ht 63.0 in

## 2020-06-18 DIAGNOSIS — J449 Chronic obstructive pulmonary disease, unspecified: Secondary | ICD-10-CM

## 2020-06-18 DIAGNOSIS — J029 Acute pharyngitis, unspecified: Secondary | ICD-10-CM

## 2020-06-18 DIAGNOSIS — R058 Other specified cough: Secondary | ICD-10-CM | POA: Insufficient documentation

## 2020-06-18 MED ORDER — PREDNISONE 20 MG PO TABS: 40.0000 mg | ORAL_TABLET | Freq: Every day | ORAL | 0 refills | Status: AC

## 2020-06-18 NOTE — Patient Instructions (Signed)
What You Can Do to Feel Better When You Have a Viral Illness Common Symptoms: runny eyes, muscle aches, throat irritation, ear pain, cough, sneezing, runny nose or congested nose, sinus pressure, headache, fever, fatigue  Please start Prednisone 40mg  daily for 5 days. Use your inhalers as prescribed. Call if worsening.   For your cough/sore throat, try these:  Teaspoon of honey either alone or mixed with warm water  Tessalon pearls  Lozenges or hard candies  Laying with head elevated  Vicks/menthol rub topically on chest  Chloraseptic spray   For your congestion, try these:   Steroid nasal spray such as fluticasone or budesonide- helps prevent swelling in the nasal passage  Guaifenesin (mucinex) - helps thin the mucus  Steam- in a closed bathroom with hot shower running or a bedside humidifier  Drinking plenty of fluids to stay hydrated can help thin mucus   For your runny nose:   Atrovent nasal spray- helps decrease the drainage (can lead to dryness if overdone)  Nasal rinses such as a netty pot or a bulb syringe using filtered water mixed with small amount of baking soda and/or sea salt   For your fever:   Acetaminophen (Tylenol) up to 4g per day for most people  Ibuprofen 600mg  up to three times per day for most people   For your sore throat:  Drinking either warm or cold liquids (whichever feels best to you)  Gargling warm salt water     Help prevent spreading of infection to others.  Wash your hands frequently  Avoid crowded places  Wear a mask when in public  Get your regularly scheduled vaccinations as they are recommended by the CDC.   Fun facts: -Antibiotics treat bacteria and have no effect on viruses so are not helpful in the vast majority of upper respiratory illnesses which are caused by common cold or flu viruses.  -Generic over the counter (OTC) medications have the same active ingredients and effectiveness of the more expensive name-brand  version. -Vaccines are available to prevent infection with several of the most infectious/deadly viruses.

## 2020-06-18 NOTE — Assessment & Plan Note (Addendum)
Acute sore throat and cough x 1 week without fever or red flag symptoms. Etiology unclear although likely viral etiology. Lung exam overall unremarkable. COVID is high on differential and test currently pending. NO concern for pneumonia at this time. Low suspicion for COPD exacerbation however given her PMH and current smoking status, she is at risk of this worsening thus with shared decision making we opted to treat with oral steroid burst. Provided symptomatic treatment options to help with cough and sore throat.  - patient to follow up COVID test - Start Prednisone 40mg  QD x 5 day - continue COPD inhalers as prescribed, can use rescue inhaler q4 hours if needed - hold on antibiotics at this time given low suspicion at this time - patient to follow up if fails to improve or worsening. Return precautions discussed. Consider CXR at that time. - work note provided

## 2020-06-18 NOTE — Progress Notes (Signed)
   Subjective:   Patient ID: Julie Hensley    DOB: 1961/04/08, 60 y.o. female   MRN: [id number]  Julie Hensley is a 60 y.o. female with a history of hypertension, COPD, tobacco abuse, esophageal reflux, thrush, anxiety, insomnia here for cough and sore throat  Cough, sore throat: Patient endorses nonproductive cough and sore throat x1 week.  She also endorses an abnormal taste in her mouth.  Denies any fevers or shortness of breath.  [facility]  She denies any epigastric pain.  She has been Covid vaccinated x2.  She does currently smoke daily.  Endorses compliance with her inhalers.   Review of Systems:  Per HPI.   Objective:   BP (!) 148/77   Pulse 87   Temp 99.9 F (37.7 C) (Oral)   Ht 5\' 3"  (1.6 m)   SpO2 98%   BMI 27.99 kg/m  Vitals and nursing note reviewed.  General: pleasant older than stated age, sitting comfortably in exam chair, well nourished, well developed, in no acute distress with non-toxic appearance HEENT: normocephalic, atraumatic, moist mucous membranes, oropharynx clear without erythema or exudate, TM abnormal at baseline but no erythema or evidence of infection, area of linear yellow crusting extending from canal opening to TM, tender to touch, no evidence of vesicles anywhere along canal or TM CV: regular rate and rhythm without murmurs, rubs, or gallops Lungs: few scattered wheezes that clear with coughing, otherwise clear to auscultation bilaterally with normal work of breathing on room air, no rhonchi or crackles appreciated, speaking in full sentences Skin: warm, dry Extremities: warm and well perfused MSK: gait normal Neuro: Alert and oriented, speech normal  Assessment & Plan:   Dry cough Acute sore throat and cough x 1 week without fever or red flag symptoms. Etiology unclear although likely viral etiology. Lung exam overall unremarkable. COVID is high on differential and test currently pending. NO concern for  pneumonia at this time. Low suspicion for COPD exacerbation however given her PMH and current smoking status, she is at risk of this worsening thus with shared decision making we opted to treat with oral steroid burst. Provided symptomatic treatment options to help with cough and sore throat.  - patient to follow up COVID test - Start Prednisone 40mg  QD x 5 day - continue COPD inhalers as prescribed, can use rescue inhaler q4 hours if needed - hold on antibiotics at this time given low suspicion at this time - patient to follow up if fails to improve or worsening. Return precautions discussed. Consider CXR at that time. - work note provided  No orders of the defined types were placed in this encounter.  Meds ordered this encounter  Medications  . predniSONE (DELTASONE) 20 MG tablet    Sig: Take 2 tablets (40 mg total) by mouth daily with breakfast for 5 days.    Dispense:  10 tablet    Refill:  0    Mina Marble, DO PGY-3, [facility] 06/18/2020 5:42 PM

## 2020-06-19 ENCOUNTER — Other Ambulatory Visit: Payer: Self-pay | Admitting: Family Medicine

## 2020-06-19 DIAGNOSIS — J449 Chronic obstructive pulmonary disease, unspecified: Secondary | ICD-10-CM

## 2020-06-22 ENCOUNTER — Telehealth: Payer: Self-pay

## 2020-06-22 DIAGNOSIS — J441 Chronic obstructive pulmonary disease with (acute) exacerbation: Secondary | ICD-10-CM

## 2020-06-22 MED ORDER — AZITHROMYCIN 250 MG PO TABS: ORAL_TABLET | ORAL | 0 refills | Status: DC

## 2020-06-22 NOTE — Telephone Encounter (Signed)
Spoke with patient.  She is continuing to not feel well.  Has a lot of chest congestion and is coughing up green phlegm.  No fevers.  COVID test came back negative.  She finished her steroids tomorrow.  Advised her will go ahead and send in Rx for azithromycin given this has been >1 week without improvement.    She also reports that she has been having worsening in shaking and in her memory.  She wants to make an appointment to discuss this more and see if she needs a specialist.  She has insurance now.  Appointment made for 3/3 at 11:10 AM.  If she is unable to make this, she will call to change it.  Advised her that if no improvement after abx, should be seen sooner.  Also discussed to be seen right away if she has difficulty breathing.

## 2020-06-22 NOTE — Telephone Encounter (Signed)
Patient calls nurse line requesting to "touch base with PCP". Patient states that she was seen in clinic on 2/17 with Dr. Tarry Kos. Patient was not pleased with previous interaction at last visit.   Reports slight improvement of symptoms, but has now developed an earache. Reports that COVID test was negative, no fever, body aches.   Patient was started on 5 day course of prednisone. Reports minimal improvement. Advised patient that appointment would likely be needed to re-evaluate ears. Patient is requesting to speak with Dr. Sandi Carne prior to scheduling.   Talbot Grumbling, RN

## 2020-07-01 NOTE — Progress Notes (Signed)
SUBJECTIVE:   CHIEF COMPLAINT / HPI:   Memory concern Reports that she is been having a decreased memory for many months Especially over the last few months she has noticed that she loses her thought in the middle of sentences Also reports that she has been having worsening of her anxiety and shakes a lot States that at work she has dropped things multiple times while she is working due to her shaking She is very concerned about her memory and states that she would like to go to a specialist for this  Anxiety Denies SI This is greatly affecting her life and her ability to work States that she is very nervous about her memory and many different things BuSpar has not been working for her so she has stopped it She is still taking Elavil 300 mg at night and states that this is the only thing that has worked for her previously  [employer] from 07/02/2020 in [place]  PHQ-9 Total Score 11     GAD 7 : Generalized Anxiety Score 07/02/2020 02/12/2020 01/13/2020  Nervous, Anxious, on Edge 3 3 3   Control/stop worrying 3 3 3   Worry too much - different things 3 3 3   Trouble relaxing 3 3 3   Restless 1 3 2   Easily annoyed or irritable 0 1 1  Afraid - awful might happen 0 0 1  Total GAD 7 Score 13 16 16   Anxiety Difficulty Very difficult - -      PERTINENT  PMH / PSH: HTN, COPD, reflux, tobacco abuse, anxiety  OBJECTIVE:   BP 126/79   Pulse 87   Ht 5\' 3"  (1.6 m)   Wt 160 lb 6.4 oz (72.8 kg)   SpO2 92%   BMI 28.41 kg/m    Physical Exam:  General: 60 y.o. female Lungs: Breathing comfortably on room air Skin: warm and dry Psych: Intermittently tearful, anxious on examination, intermittently loses train of thought, appropriate dress, no SI  MoCA: 15/30   ASSESSMENT/PLAN:   Cognitive impairment MoCA performed today with a score of 15 out of 30.  This is very concerning especially given the patient's age.  She is on a very high dose of  an anticholinergic medication, therefore discussed with Dr. Andria Frames who agrees that this needs to be removed immediately.  Discussed with patient, who is agreeable if this is affecting her memory.  We will taper her off of this per below.  We will also check for reversible causes of dementia, obtain CMP, CBC, HIV, RPR, TSH, noncontrast head CT.  Given her request and concern for her memory, will go ahead and refer her to neurology, especially since she is also having some shaking.  She will follow-up with me in 1 month.  Anxiety state Patient continues to be very poorly controlled and has not been able to have improvement with BuSpar.  Given her memory concerns, will taper her off of Elavil and slowly start Zoloft as this can be more sedating for her and hopefully more beneficial for anxiety state and with her sleeping.  We will have her do Elavil 150 mg for 1 week while also starting Zoloft 25 mg for 1 week.  Week 2 she will take half a tablet of Elavil and continue with Zoloft 25 mg.  Week 3 she will discontinue use of Elavil and increase Zoloft to 50 mg daily.  She will follow-up in 1 month.  Advised her that she would likely  not see improvement for at least 4 weeks on Zoloft at maximum dose and maximum improvement would be at 6 to 8 weeks.  I have referred her to psychiatry as well so they can better manage her anxiety.  She will follow-up in 1 month.     Cleophas Dunker, [place]

## 2020-07-02 ENCOUNTER — Ambulatory Visit (INDEPENDENT_AMBULATORY_CARE_PROVIDER_SITE_OTHER): Payer: 59 | Admitting: Family Medicine

## 2020-07-02 ENCOUNTER — Encounter: Payer: Self-pay | Admitting: Family Medicine

## 2020-07-02 ENCOUNTER — Other Ambulatory Visit: Payer: Self-pay

## 2020-07-02 VITALS — BP 126/79 | HR 87 | Ht 63.0 in | Wt 160.4 lb

## 2020-07-02 DIAGNOSIS — F411 Generalized anxiety disorder: Secondary | ICD-10-CM

## 2020-07-02 DIAGNOSIS — R4189 Other symptoms and signs involving cognitive functions and awareness: Secondary | ICD-10-CM | POA: Insufficient documentation

## 2020-07-02 MED ORDER — SERTRALINE HCL 25 MG PO TABS: 25.0000 mg | ORAL_TABLET | Freq: Every day | ORAL | 1 refills | Status: DC

## 2020-07-02 NOTE — Assessment & Plan Note (Signed)
Patient continues to be very poorly controlled and has not been able to have improvement with BuSpar.  Given her memory concerns, will taper her off of Elavil and slowly start Zoloft as this can be more sedating for her and hopefully more beneficial for anxiety state and with her sleeping.  We will have her do Elavil 150 mg for 1 week while also starting Zoloft 25 mg for 1 week.  Week 2 she will take half a tablet of Elavil and continue with Zoloft 25 mg.  Week 3 she will discontinue use of Elavil and increase Zoloft to 50 mg daily.  She will follow-up in 1 month.  Advised her that she would likely not see improvement for at least 4 weeks on Zoloft at maximum dose and maximum improvement would be at 6 to 8 weeks.  I have referred her to psychiatry as well so they can better manage her anxiety.  She will follow-up in 1 month.

## 2020-07-02 NOTE — Patient Instructions (Addendum)
Thank you for coming to see me today. It was a pleasure. Today we talked about:   We will get some labs today.  If they are abnormal or we need to do something about them, I will call you.  If they are normal, I will send you a message on MyChart (if it is active) or a letter in the mail.  If you don't hear from [place] in 2 weeks, please call the office at the number below.  I have placed a referral to Neurology and Psychiatry for your memory and your mood.  If you do not hear from them in the next 2 weeks, please give [place] a call.  We will also schedule you for a CT scan of your head.  Take your elavil 150mg  every night for 1 week.  While you take this, start zoloft at 25mg  every night.  Week two, take half a tablet of elavil and continue zoloft 25mg  nightly.  Week three, stop taking elavil, and increase zoloft to 50mg  nightly (two tablets).  Please follow-up with me in 1 month.  If you have any questions or concerns, please do not hesitate to call the office at [phone].  Best,   [place] Constable, DO

## 2020-07-02 NOTE — Assessment & Plan Note (Signed)
MoCA performed today with a score of 15 out of 30.  This is very concerning especially given the patient's age.  She is on a very high dose of an anticholinergic medication, therefore discussed with Dr. Andria Frames who agrees that this needs to be removed immediately.  Discussed with patient, who is agreeable if this is affecting her memory.  We will taper her off of this per below.  We will also check for reversible causes of dementia, obtain CMP, CBC, HIV, RPR, TSH, noncontrast head CT.  Given her request and concern for her memory, will go ahead and refer her to neurology, especially since she is also having some shaking.  She will follow-up with me in 1 month.

## 2020-07-03 LAB — RPR: RPR Ser Ql: NONREACTIVE

## 2020-07-03 LAB — COMPREHENSIVE METABOLIC PANEL
ALT: 14 IU/L (ref 0–32)
AST: 18 IU/L (ref 0–40)
Albumin/Globulin Ratio: 1.9 (ref 1.2–2.2)
Albumin: 4.3 g/dL (ref 3.8–4.9)
Alkaline Phosphatase: 124 IU/L — ABNORMAL HIGH (ref 44–121)
BUN/Creatinine Ratio: 7 — ABNORMAL LOW (ref 12–28)
BUN: 6 mg/dL — ABNORMAL LOW (ref 8–27)
Bilirubin Total: 0.3 mg/dL (ref 0.0–1.2)
CO2: 19 mmol/L — ABNORMAL LOW (ref 20–29)
Calcium: 9.8 mg/dL (ref 8.7–10.3)
Chloride: 104 mmol/L (ref 96–106)
Creatinine, Ser: 0.9 mg/dL (ref 0.57–1.00)
Globulin, Total: 2.3 g/dL (ref 1.5–4.5)
Glucose: 96 mg/dL (ref 65–99)
Potassium: 4.5 mmol/L (ref 3.5–5.2)
Sodium: 141 mmol/L (ref 134–144)
Total Protein: 6.6 g/dL (ref 6.0–8.5)
eGFR: 73 mL/min/{1.73_m2} (ref >59)

## 2020-07-03 LAB — CBC WITH DIFFERENTIAL/PLATELET
Basophils Absolute: 0 10*3/uL (ref 0.0–0.2)
Basos: 0 %
EOS (ABSOLUTE): 0.2 10*3/uL (ref 0.0–0.4)
Eos: 2 %
Hematocrit: 38.2 % (ref 34.0–46.6)
Hemoglobin: 12.6 g/dL (ref 11.1–15.9)
Immature Grans (Abs): 0.1 10*3/uL (ref 0.0–0.1)
Immature Granulocytes: 1 %
Lymphocytes Absolute: 1.2 10*3/uL (ref 0.7–3.1)
Lymphs: 13 %
MCH: 28.5 pg (ref 26.6–33.0)
MCHC: 33 g/dL (ref 31.5–35.7)
MCV: 86 fL (ref 79–97)
Monocytes Absolute: 0.6 10*3/uL (ref 0.1–0.9)
Monocytes: 7 %
Neutrophils Absolute: 7.3 10*3/uL — ABNORMAL HIGH (ref 1.4–7.0)
Neutrophils: 77 %
Platelets: 328 10*3/uL (ref 150–450)
RBC: 4.42 x10E6/uL (ref 3.77–5.28)
RDW: 13.6 % (ref 11.7–15.4)
WBC: 9.5 10*3/uL (ref 3.4–10.8)

## 2020-07-03 LAB — HIV ANTIBODY (ROUTINE TESTING W REFLEX): HIV Screen 4th Generation wRfx: NONREACTIVE

## 2020-07-03 LAB — TSH: TSH: 0.549 u[IU]/mL (ref 0.450–4.500)

## 2020-07-09 ENCOUNTER — Encounter: Payer: Self-pay | Admitting: Family Medicine

## 2020-07-13 ENCOUNTER — Ambulatory Visit (INDEPENDENT_AMBULATORY_CARE_PROVIDER_SITE_OTHER): Payer: 59 | Admitting: Student in an Organized Health Care Education/Training Program

## 2020-07-13 ENCOUNTER — Other Ambulatory Visit: Payer: Self-pay

## 2020-07-13 DIAGNOSIS — B370 Candidal stomatitis: Secondary | ICD-10-CM | POA: Diagnosis not present

## 2020-07-13 DIAGNOSIS — B9689 Other specified bacterial agents as the cause of diseases classified elsewhere: Secondary | ICD-10-CM | POA: Diagnosis not present

## 2020-07-13 DIAGNOSIS — J019 Acute sinusitis, unspecified: Secondary | ICD-10-CM | POA: Diagnosis not present

## 2020-07-13 MED ORDER — NYSTATIN 100000 UNIT/ML MT SUSP: 5.0000 mL | Freq: Four times a day (QID) | OROMUCOSAL | 0 refills | Status: DC

## 2020-07-13 MED ORDER — AMOXICILLIN 500 MG PO CAPS: 500.0000 mg | ORAL_CAPSULE | Freq: Two times a day (BID) | ORAL | 0 refills | Status: AC

## 2020-07-13 NOTE — Progress Notes (Signed)
   SUBJECTIVE:   CHIEF COMPLAINT / HPI: congestion/cough  Congestion- green mucus for about a month. Had an episode of improvement and has now worsened again. Also having a cough, post-nasal drip. When she leans over, feels worse. Denies fevers or SOB.  Thrush- patient endorses white plaques in her mouth and tongue pain for the past 2 days.Completed steroid pack about 1-2 weeks ago.  60yo was last period  OBJECTIVE:   BP 140/85   Pulse 88   Temp 98.4 F (36.9 C)   Ht 5\' 3"  (1.6 m)   SpO2 96%   BMI 28.41 kg/m   Physical Exam Vitals and nursing note reviewed.  Constitutional:      Appearance: Normal appearance.  HENT:     Head: Normocephalic.     Comments: Positive sensitivity to frontal/maxillary sinus percussion    Right Ear: External ear normal. No tenderness. Tympanic membrane is scarred.     Left Ear: External ear normal. No tenderness. Tympanic membrane is scarred.     Ears:     Comments: Bilateral TM scarring. Negative for purulence or erythema. Positive for effusion, non-bulging. Hearing grossly intact    Nose: Congestion and rhinorrhea present.     Mouth/Throat:     Lips: Pink.     Mouth: Mucous membranes are moist. Oral lesions present.     Dentition: Has dentures (removed dentures for exam which cover her hard palate. there were excessive white plaques covering her hard pallet).     Tongue: Lesions present.     Palate: Lesions present.     Pharynx: Oropharynx is clear. Posterior oropharyngeal erythema present. No pharyngeal swelling or oropharyngeal exudate.     Tonsils: No tonsillar exudate.  Cardiovascular:     Rate and Rhythm: Normal rate.  Pulmonary:     Effort: Pulmonary effort is normal.  Lymphadenopathy:     Cervical: Cervical adenopathy present.  Neurological:     Mental Status: She is alert.    ASSESSMENT/PLAN:   Acute bacterial sinusitis >3 weeks sinus congestion that waned, then worsened and not improved with OTC interventions with purulent  nasal discharge is suspicious for acute bacterial sinusitis.  - prescribed amoxicillin - return if not improving with treatment  Thrush H/o thrush.  Present on exam today, particularly on hard palate above denture.  Possibly related to recent steroid use.  Prescribing nystatin swish     Richarda Osmond, [place]

## 2020-07-13 NOTE — Patient Instructions (Signed)
It was a pleasure to see you today!  To summarize our discussion for this visit:  It appears that you have acute sinusitis. We will treat with antibiotic. If you do not get better after treatment, please come back in to be seen  Additionally, it looks like you have thrush. I have sent in an oral rinse to use for 1-2 weeks   Some additional health maintenance measures we should update are: Health Maintenance Due  Topic Date Due  . Hepatitis C Screening  Never done  . PAP SMEAR-Modifier  Never done  . COLONOSCOPY (Pts 45-46yrs Insurance coverage will need to be confirmed)  Never done  . MAMMOGRAM  Never done  .    Call the clinic at [phone] if your symptoms worsen or you have any concerns.   Thank you for allowing me to take part in your care,  Dr. Lavona Mound, Adult Oral thrush, also called oral candidiasis, is a fungal infection that develops in the mouth and throat and on the tongue. It causes white patches to form in the mouth and on the tongue. Many cases of thrush are mild, but this infection can also be serious. Ritta Slot can be a repeated (recurrent) problem for certain people who have a weak body defense system (immune system). The weakness can be caused by chronic illnesses, or by taking medicines that limit the body's ability to fight infection. If a person has difficulty fighting infection, the fungus that causes thrush can spread through the body. This can cause life-threatening blood or organ infections. What are the causes? This condition is caused by a fungus (yeast) called Candida albicans.  This fungus is normally present in small amounts in the mouth and on other mucous membranes. It usually causes no harm.  If conditions are present that allow the fungus to grow without control, it invades surrounding tissues and becomes an infection.  Other Candida species can also lead to thrush, though this is rare. What increases the risk? The following  factors may make you more likely to develop this condition:  Having a weakened immune system.  Being an older adult.  Having diabetes, cancer, or HIV (human immunodeficiency virus).  Having dry mouth (xerostomia).  Being pregnant or breastfeeding.  Having poor dental care, especially in those who have dentures.  Using antibiotic or steroid medicines. What are the signs or symptoms? Symptoms of this condition can vary from mild and moderate to severe and persistent. Symptoms may include:  A burning feeling in the mouth and throat. This can occur at the start of a thrush infection.  White patches that stick to the mouth and tongue. The tissue around the patches may be red, raw, and painful. If rubbed (during tooth brushing, for example), the patches and the tissue of the mouth may bleed easily.  A bad taste in the mouth or difficulty tasting foods.  A cottony feeling in the mouth.  Pain during eating and swallowing.  Poor appetite.  Cracking at the corners of the mouth.   How is this diagnosed? This condition is diagnosed based on:  A physical exam.  Your medical history. How is this treated? This condition is treated with medicines called antifungals, which prevent the growth of fungi. These medicines are either applied directly to the affected area (topical) or swallowed (oral). The treatment will depend on the severity of the condition.  Mild cases of thrush may be treated with an antifungal mouth rinse or lozenges. Treatment usually  lasts about 14 days.  Moderate to severe cases of thrush can be treated with oral antifungal medicine, if they have spread to the esophagus. A topical antifungal medicine may also be used. For some severe infections, treatment may need to continue for more than 14 days. ? Oral antifungal medicines are rarely used during pregnancy because they may be harmful to the unborn child. If you are pregnant, talk with your health care provider about  options for treatment.  Persistent or recurrent thrush. For cases of thrush that do not go away or keep coming back: ? Treatment may be needed twice as long as the symptoms last. ? Treatment will include both oral and topical antifungal medicines. ? People with a weakened immune system can take an antifungal medicine on a continuous basis to prevent thrush infections. It is important to treat conditions that make a person more likely to get thrush, such as diabetes or HIV. Follow these instructions at home: Medicines  Take or use over-the-counter and prescription medicines only as told by your health care provider.  Talk with your health care provider about an over-the-counter medicine called gentian violet, which kills bacteria and fungi. Relieving soreness and discomfort To help reduce the discomfort of thrush:  Drink cold liquids such as water or iced tea.  Try flavored ice treats or frozen juices.  Eat foods that are easy to swallow, such as gelatin, ice cream, or custard.  Try drinking from a straw if the patches in your mouth are painful.   General instructions  Eat plain, unflavored yogurt as directed by your health care provider. Check the label to make sure the yogurt contains live cultures. This yogurt can help healthy bacteria grow in the mouth and can stop the growth of the fungus that causes thrush.  If you wear dentures, remove the dentures before going to bed, brush them vigorously, and soak them in a cleaning solution as directed by your health care provider.  Rinse your mouth with a warm salt-water mixture several times a day. To make a salt-water mixture, dissolve -1 tsp (3-6 g) of salt in 1 cup (237 mL) of warm water. Contact a health care provider if:  Your symptoms are getting worse or are not improving within 7 days of starting treatment.  You have symptoms of a spreading infection, such as white patches on the skin outside of the mouth.  You are  breastfeeding your baby and you have redness and pain in the nipples. Summary  Oral thrush, also called oral candidiasis, is a fungal infection that develops in the mouth and throat and on the tongue. It causes white patches to form in the mouth and on the tongue.  You are more likely to get this condition if you have a weakened immune system or an underlying condition, such as HIV, cancer, or diabetes.  This condition is treated with medicines called antifungals, which prevent the growth of fungi.  Contact a health care provider if your symptoms do not improve, or get worse, within 7 days of starting treatment. This information is not intended to replace advice given to you by your health care provider. Make sure you discuss any questions you have with your health care provider. Document Revised: 02/22/2019 Document Reviewed: 02/22/2019 Elsevier Patient Education  [place].

## 2020-07-15 DIAGNOSIS — B9689 Other specified bacterial agents as the cause of diseases classified elsewhere: Secondary | ICD-10-CM | POA: Insufficient documentation

## 2020-07-15 NOTE — Assessment & Plan Note (Signed)
H/o thrush.  Present on exam today, particularly on hard palate above denture.  Possibly related to recent steroid use.  Prescribing nystatin swish

## 2020-07-15 NOTE — Assessment & Plan Note (Signed)
>  3 weeks sinus congestion that waned, then worsened and not improved with OTC interventions with purulent nasal discharge is suspicious for acute bacterial sinusitis.  - prescribed amoxicillin - return if not improving with treatment

## 2020-07-21 ENCOUNTER — Ambulatory Visit (HOSPITAL_COMMUNITY)
Admission: RE | Admit: 2020-07-21 | Discharge: 2020-07-21 | Disposition: A | Discharge status: Home / Self Care | Payer: 59 | Source: Ambulatory Visit | Attending: Family Medicine | Admitting: Family Medicine

## 2020-07-21 ENCOUNTER — Other Ambulatory Visit: Payer: Self-pay

## 2020-07-21 DIAGNOSIS — R4189 Other symptoms and signs involving cognitive functions and awareness: Secondary | ICD-10-CM | POA: Insufficient documentation

## 2020-07-22 ENCOUNTER — Encounter: Payer: Self-pay | Admitting: Family Medicine

## 2020-08-04 NOTE — Progress Notes (Signed)
SUBJECTIVE:   CHIEF COMPLAINT / HPI:   Cognitive impairment Last seen for this on 07/29/2020, at that time had an MoCA 15/30 Labs did not show any signs of reversible dementia, CT head negative She has an appointment with neurology on 09/03/2020 She is now weaned off of Elavil and onto Zoloft, but no longer taking Zoloft Only took Zoloft twice, states that antidepressants make her worse Shaking has improved some since discontinuing Elavil  Anxiety Changed from Elavil to Zoloft given her cognitive impairment Stated that Zoloft "made me feel like a zombie" She was referred to psychiatry Has an appointment on 5/17 She just wants to wait for psychiatry She has difficulty sleeping States that she is awake, worrying about a lot of things  COPD Current regimen: Advair, Albuterol currently, gets from [place] worse at work when hot and smelling chemicals Was out of inhalers a few weeks, but got them a few days ago Tobacco use: currently smoking 5 cigarettes a day She is working to decrease that She would like to see a pulmonologist now that she has insurance  She does not know when her last pap smear, has not had a hysterectomy She declines COVID booster  PERTINENT  PMH / PSH: HTN, COPD, tobacco abuse, insomnia, anxiety state  OBJECTIVE:   BP 122/80   Pulse 79   Ht 5\' 3"  (1.6 m)   Wt 156 lb (70.8 kg)   SpO2 94%   BMI 27.63 kg/m    Physical Exam:  General: 60 y.o. female in NAD Cardio: RRR no m/r/g Lungs: CTAB, no wheezing, no rhonchi, no crackles, no IWOB on RA Skin: warm and dry Extremities: No edema Psych: Mood and affect appropriate for circumstance, appropriate dress, thought process linear and logical, good insight, no SI   ASSESSMENT/PLAN:   Cognitive impairment Stable.  The patient has had some improvement in shaking with discontinuing Elavil, has otherwise not noticed any changes in her cognition.  Again, advised patient that would not recommend that  she continue on this medication.  She is understanding.  She has an appointment to follow-up with neurology next month.  Her reversible causes of dementia were not revealing of any cause and CT head was normal.  Anxiety state Currently not well controlled.  She did not tolerate Zoloft.  She is no longer taking amitriptyline per above.  Did discuss with her starting another SSRI, specifically citalopram, but patient declined.  She would like to wait for psychiatry.  She did report some insomnia, see below.  Follow-up with psychiatry next month.  Insomnia Patient reports difficulty sleeping, this is likely related to her anxiety as she reports that she is up thinking about all of her problems and follow-up that is needed.  Given that she well be following with psychiatry, they will likely make changes to help with this.  Can start her on a small dose of hydroxyzine to use at night only for now.  Would be careful with this given antihistamine.  Tobacco abuse Continue to encourage cessation.  COPD (chronic obstructive pulmonary disease) ([place]) Patient has had difficulty obtaining medications at the correct time in the past due to using [facility] for cost.  Her lungs sound clear today.  She has not had PFTs in quite some time.  Will refer her to pulmonology for further follow-up.   She was made aware that she is due for a Pap smear.  Return at your convenience for this.  Bernita Raisin Marit Goodwill,  [place]

## 2020-08-05 ENCOUNTER — Ambulatory Visit (INDEPENDENT_AMBULATORY_CARE_PROVIDER_SITE_OTHER): Payer: 59 | Admitting: Family Medicine

## 2020-08-05 ENCOUNTER — Encounter: Payer: Self-pay | Admitting: Family Medicine

## 2020-08-05 ENCOUNTER — Other Ambulatory Visit: Payer: Self-pay

## 2020-08-05 VITALS — BP 122/80 | HR 79 | Ht 63.0 in | Wt 156.0 lb

## 2020-08-05 DIAGNOSIS — R4189 Other symptoms and signs involving cognitive functions and awareness: Secondary | ICD-10-CM | POA: Diagnosis not present

## 2020-08-05 DIAGNOSIS — J449 Chronic obstructive pulmonary disease, unspecified: Secondary | ICD-10-CM

## 2020-08-05 DIAGNOSIS — Z720 Tobacco use: Secondary | ICD-10-CM

## 2020-08-05 DIAGNOSIS — F411 Generalized anxiety disorder: Secondary | ICD-10-CM

## 2020-08-05 DIAGNOSIS — G4700 Insomnia, unspecified: Secondary | ICD-10-CM | POA: Diagnosis not present

## 2020-08-05 MED ORDER — HYDROXYZINE HCL 10 MG PO TABS: 10.0000 mg | ORAL_TABLET | Freq: Every evening | ORAL | 0 refills | Status: DC | PRN

## 2020-08-05 NOTE — Assessment & Plan Note (Signed)
Stable.  The patient has had some improvement in shaking with discontinuing Elavil, has otherwise not noticed any changes in her cognition.  Again, advised patient that would not recommend that she continue on this medication.  She is understanding.  She has an appointment to follow-up with neurology next month.  Her reversible causes of dementia were not revealing of any cause and CT head was normal.

## 2020-08-05 NOTE — Assessment & Plan Note (Signed)
Continue to encourage cessation. 

## 2020-08-05 NOTE — Assessment & Plan Note (Signed)
Currently not well controlled.  She did not tolerate Zoloft.  She is no longer taking amitriptyline per above.  Did discuss with her starting another SSRI, specifically citalopram, but patient declined.  She would like to wait for psychiatry.  She did report some insomnia, see below.  Follow-up with psychiatry next month.

## 2020-08-05 NOTE — Patient Instructions (Addendum)
Thank you for coming to see me today. It was a pleasure. Today we talked about:   I have placed a referral to Pulmonology for your lungs.  If you do not hear from them in the next 2 weeks, please give [place] a call.  Follow up as scheduled with neurology and psychiatry.  Call the [facility] and let them know that you would like a female provider.  Please follow-up with PCP in  3 months.  If you have any questions or concerns, please do not hesitate to call the office at [phone].  Best,   [place] Constable, DO

## 2020-08-05 NOTE — Assessment & Plan Note (Signed)
Patient has had difficulty obtaining medications at the correct time in the past due to using community [facility] wellness for cost.  Her lungs sound clear today.  She has not had PFTs in quite some time.  Will refer her to pulmonology for further follow-up.

## 2020-08-05 NOTE — Assessment & Plan Note (Signed)
Patient reports difficulty sleeping, this is likely related to her anxiety as she reports that she is up thinking about all of her problems and follow-up that is needed.  Given that she well be following with psychiatry, they will likely make changes to help with this.  Can start her on a small dose of hydroxyzine to use at night only for now.  Would be careful with this given antihistamine.

## 2020-08-12 ENCOUNTER — Encounter: Payer: Self-pay | Admitting: Family Medicine

## 2020-08-24 ENCOUNTER — Encounter: Payer: Self-pay | Admitting: Family Medicine

## 2020-08-27 ENCOUNTER — Other Ambulatory Visit: Payer: Self-pay | Admitting: Family Medicine

## 2020-08-27 DIAGNOSIS — G4700 Insomnia, unspecified: Secondary | ICD-10-CM

## 2020-09-03 ENCOUNTER — Encounter: Payer: Self-pay | Admitting: Neurology

## 2020-09-03 ENCOUNTER — Ambulatory Visit (INDEPENDENT_AMBULATORY_CARE_PROVIDER_SITE_OTHER): Payer: 59 | Admitting: Neurology

## 2020-09-03 VITALS — BP 144/86 | HR 80 | Ht 63.0 in | Wt 154.0 lb

## 2020-09-03 DIAGNOSIS — F984 Stereotyped movement disorders: Secondary | ICD-10-CM | POA: Diagnosis not present

## 2020-09-03 DIAGNOSIS — F419 Anxiety disorder, unspecified: Secondary | ICD-10-CM

## 2020-09-03 DIAGNOSIS — R419 Unspecified symptoms and signs involving cognitive functions and awareness: Secondary | ICD-10-CM

## 2020-09-03 NOTE — Progress Notes (Signed)
Subjective:    Patient ID: Julie Hensley is a 60 y.o. female.  HPI     Star Age, MD, PhD [facility] [address] [place], [place] [address]  Dear Dr. Sandi Carne,  I saw your patient, Julie Hensley, upon your kind request in my neurologic clinic today for initial consultation of her memory loss.  The patient is unaccompanied today.  As you know, [name] is a 60 year old right-handed woman with an underlying medical history of reflux disease, anxiety, arthritis, COPD, hypertension, and overweight state, who reports short-term memory issues for the past 2 to 3 months.  She reports being forgetful primarily. She reports that since she was taken off of amitriptyline her short-term memory has improved.  She still has significant anxiety.  She has not yet seen psychiatry or the lung specialist.  Of note, she smokes about 9 or 10 cigarettes/day.  She reports no alcohol consumption since 2013.  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   She has not had any recent falls.  She feels shaky and anxious at work even.  She has had significant residual anxiety.  She does not sleep very well. She reports no family history of memory loss or dementia.  She is divorced, she has 1 grown daughter.  Mom lives with her.  She works as a Research scientist (physical sciences).  I reviewed your office note from 07/02/2020.  Her MoCA was 15 out of 30 at the time.  She had significant anxiety at the time, she was advised to taper off amitriptyline as she was on a high dose at the time.  She had blood work through your office including HIV, RPR, CBC with differential, CMP and TSH.  I reviewed her blood test results.  She had benign results.  Her BUN was 6, creatinine 0.9, alkaline phosphatase was borderline at 124, TSH was 0.549.  HIV was nonreactive.  She had a head CT without contrast on 07/22/2020 and I reviewed the results: IMPRESSION: No  evidence of acute intracranial abnormality.   Stable, mild generalized cerebral atrophy and minimal small vessel ischemic disease.  She had a brain MRI without contrast on 12/16/2014 and I reviewed the results:  IMPRESSION: 1. No acute intracranial infarct or other abnormality identified. 2. Mild atrophy with chronic small vessel ischemic disease involving the periventricular white matter. 3. Left maxillary sinus disease.   She was referred to pulmonology for COPD management and to psychiatry for anxiety management. She was recently advised to try melatonin for sleep.  Her Past Medical History Is Significant For: Past Medical History:  Diagnosis Date  . Acid reflux   . Anxiety   . Arthritis   . Complication of anesthesia   . COPD (chronic obstructive pulmonary disease) ([place])   . Hypertension    HBP at [employer] machine but never been to doctor for it.   Marland Kitchen PONV (postoperative nausea and vomiting)     Her Past Surgical History Is Significant For: Past Surgical History:  Procedure Laterality Date  . APPENDECTOMY    . MANDIBLE FRACTURE SURGERY    . TONSILLECTOMY      Her Family History Is Significant For: Family History  Problem Relation Age of Onset  . Hypertension Mother   . Heart disease Mother   . COPD Mother   . Hypertension Father   . Stroke Father   . Hypertension Brother   . Diabetes  Paternal Grandmother   . Anesthesia problems Neg Hx     Her Social History Is Significant For: Social History   Socioeconomic History  . Marital status: Divorced    Spouse name: Not on file  . Number of children: 1  . Years of education: Not on file  . Highest education level: Not on file  Occupational History  . Occupation: [employer] in [place] Use  . Smoking status: Current Every Day Smoker    Packs/day: 0.50    Years: 40.00    Pack years: 20.00    Types: Cigarettes  . Smokeless tobacco: Never Used  Substance and Sexual Activity  . Alcohol use: No     Alcohol/week: 0.0 standard drinks    Comment: has not had alcohol in a month.  Previously a case of beer daily  . Drug use: No  . Sexual activity: Yes    Birth control/protection: Post-menopausal    Comment: sex x1 in past year 2 weeks ago  Other Topics Concern  . Not on file  Social History Narrative  . Not on file   Social Determinants of Health   Financial Resource Strain: Not on file  Food Insecurity: Not on file  Transportation Needs: Unmet Transportation Needs  . Lack of Transportation (Medical): Yes  . Lack of Transportation (Non-Medical): No  Physical Activity: Not on file  Stress: Stress Concern Present  . Feeling of Stress : Very much  Social Connections: Not on file    Her Allergies Are:  No Known Allergies:   Her Current Medications Are:  Outpatient Encounter Medications as of 09/03/2020  Medication Sig  . acetaminophen (TYLENOL) 500 MG tablet Take 1,000-1,500 mg by mouth daily as needed for headache.  . albuterol (VENTOLIN HFA) 108 (90 Base) MCG/ACT inhaler TAKE 2 PUFFS INTO LUNGS EVERY 4 HOURS AS NEEDED  . hydrochlorothiazide (HYDRODIURIL) 25 MG tablet Take 25 mg by mouth daily.  . hydrOXYzine (ATARAX/VISTARIL) 10 MG tablet TAKE 1 TABLET (10 MG TOTAL) BY MOUTH AT BEDTIME AS NEEDED (INSOMNIA).  Marland Kitchen Fluticasone-Salmeterol (WIXELA INHUB) 100-50 MCG/DOSE AEPB Inhale 1 puff into the lungs in the morning and at bedtime.  . [DISCONTINUED] azithromycin (ZITHROMAX) 250 MG tablet Take two tablets on day one, then take one tablet a day for the next four days.  . [DISCONTINUED] esomeprazole (NEXIUM) 20 MG capsule Take 20 mg by mouth daily at 12 noon.  . [DISCONTINUED] gabapentin (NEURONTIN) 300 MG capsule Take three by mouth qhs  . [DISCONTINUED] nystatin (MYCOSTATIN) 100000 UNIT/ML suspension Take 4 mLs (400,000 Units total) by mouth 4 (four) times daily. Swish in the mouth for as long as possible, then swallow  . [DISCONTINUED] nystatin (MYCOSTATIN) 100000 UNIT/ML  suspension Take 5 mLs (500,000 Units total) by mouth 4 (four) times daily.  . [DISCONTINUED] omeprazole (PRILOSEC) 40 MG capsule Take 1 capsule (40 mg total) by mouth daily.   No facility-administered encounter medications on file as of 09/03/2020.  :   Review of Systems:  Out of a complete 14 point review of systems, all are reviewed and negative with the exception of these symptoms as listed below:  Review of Systems  Neurological:       Here for consult on worsening memory loss. Pt sts sx have been present for 2.-3 months. Reports short term memory is mostly affected.    Objective:  Neurological Exam  Physical Exam Physical Examination:   Vitals:   09/03/20 1439  BP: (!) 144/86  Pulse: 80  General Examination: The patient is a very pleasant 60 y.o. female in no acute distress. She appears anxious, she has intermittent body rocking but is able to stop it for short periods of time.   HEENT: Normocephalic, atraumatic, pupils are equal, round and reactive to light, extraocular tracking is well-preserved, face is symmetric with normal facial animation, no facial masking or tremor, neck is supple, no carotid bruits.  No dyskinesias.  Speech is slightly hoarse.  Chest: Clear to auscultation without wheezing, rhonchi or crackles noted.  Heart: S1+S2+0, regular and normal without murmurs, rubs or gallops noted.   Abdomen: Soft, non-tender and non-distended with normal bowel sounds appreciated on auscultation.  Extremities: There is no pitting edema in the distal lower extremities bilaterally. Pedal pulses are intact.  Skin: Warm and dry without trophic changes noted.  Musculoskeletal: exam reveals no obvious joint deformities, tenderness or joint swelling or erythema.   Neurologically:  Mental status: The patient is awake, alert and pays good attention but she is quite anxious.  She has intermittent back-and-forth body rocking but is able to stop it when requested.   On 09/03/2020:  MMSE: 24/30 (she 3 points on serial sevens, 2 points on remote recall, one-point on comprehension), CDT: 4/4, AFT: 10/min.  Cranial nerves II - XII are as described above under HEENT exam. In addition: shoulder shrug is normal with equal shoulder height noted. Motor exam: Normal bulk, strength and tone is noted. There is no drift, tremor or rebound.  In particular, no postural or action tremor or intention tremor.  No abnormal involuntary movements such as athetosis or dyskinesias.   Romberg is negative. Reflexes are 2+ throughout. Babinski: Toes are flexor bilaterally. Fine motor skills and coordination: intact with normal finger taps, normal hand movements, normal rapid alternating patting, normal foot taps and normal foot agility.  Cerebellar testing: No dysmetria or intention tremor on finger to nose testing. Heel to shin is unremarkable bilaterally. There is no truncal or gait ataxia.  Sensory exam: intact to light touch in the upper and lower extremities.  Gait, station and balance: She stands easily. No veering to one side is noted. No leaning to one side is noted. Posture is age-appropriate and stance is narrow based. Gait shows normal stride length and normal pace. No problems turning are noted. Tandem walk is unremarkable.   Assessment and Plan:   In summary, Nancyjo Givhan is a very pleasant 60 y.o.-year old female with an underlying medical history of reflux disease, anxiety, arthritis, COPD, hypertension, and overweight state, who presents for evaluation of her short-term memory loss of about 2 to 3 months duration.  Her MMSE shows improvement when compared to her low score on the MoCA in March 2022.  She does report that her anxiety is still a big issue and even though her memory function improved after she tapered off the amitriptyline, she still has issues with anxiety and she does not sleep very well.  She has not yet made an appointment with psychiatry and also had no appointment made yet  with pulmonology.  We talked about the importance of healthy lifestyle.  She is encouraged to stay well-hydrated, and try to rest well, follow-up with your office for interim anxiety management and advise regarding psychiatry and pulmonology appointments.  She was offered a referral to neuropsychology for more in-depth memory evaluation but we mutually agreed that so long as she is still this anxious that it will probably affect her memory function.  I also advised her that  we can do a more in-depth evaluation of her brain structure with an MRI, she would like to hold off until she has better management of her anxiety.  I do believe this is a good approach.  We mutually agreed to have her follow-up with your office on a scheduled basis and with her specialists as per your referrals.  I would be happy to see her back in the future for reevaluation of her memory issues.  She did not have any abnormal involuntary movements, she did have body rocking which she reported is often the case when she is nervous.  I answered all her questions today and she was in agreement with the plan. Thank you very much for allowing me to participate in the care of this nice patient. If I can be of any further assistance to you please do not hesitate to call me at [phone].  Sincerely,   Star Age, MD, PhD

## 2020-09-03 NOTE — Patient Instructions (Addendum)
It was nice to meet you today.  You had a recent head CT and blood work through your primary care.  I do believe your memory issues are tied in with your anxiety issues.  Please follow-up with your primary care regarding a referral to your specialists including the lung doctor and the psychiatrist.  I would be happy to see you back once your anxiety is under better control.  I agree with you that anxiety management will help you function better.

## 2020-09-07 ENCOUNTER — Encounter: Payer: Self-pay | Admitting: Family Medicine

## 2020-09-15 ENCOUNTER — Encounter: Payer: Self-pay | Admitting: Family Medicine

## 2020-09-15 ENCOUNTER — Ambulatory Visit (HOSPITAL_COMMUNITY): Payer: 59 | Admitting: Physician Assistant

## 2020-09-16 ENCOUNTER — Other Ambulatory Visit: Payer: Self-pay

## 2020-09-16 ENCOUNTER — Telehealth (INDEPENDENT_AMBULATORY_CARE_PROVIDER_SITE_OTHER): Payer: 59 | Admitting: Family Medicine

## 2020-09-16 DIAGNOSIS — R0989 Other specified symptoms and signs involving the circulatory and respiratory systems: Secondary | ICD-10-CM | POA: Diagnosis not present

## 2020-09-16 NOTE — Assessment & Plan Note (Signed)
Concern for COPD exacerbation, cannot rule out COVID as a cause.  Also concerned for dehydration given significantly decreased urination.  Given this, especially with her intermittent worsening in breathing, recommended evaluation of patient in-person today.  No in-person appointments available in [facility].  Recommended UC eval or ED.  Patient is agreeable.  Advised if worsens significantly or difficulty breathing, needs to call EMS right away, she voiced understanding.  Would consider CXR and BMP at least as part of workup given concern for dehydration and her recurrent COPD exacerbations to r/o pneumonia.

## 2020-09-16 NOTE — Progress Notes (Signed)
[place] Telemedicine Visit  Patient consented to have virtual visit and was identified by name and date of birth. Method of visit: Telephone  Encounter participants: Patient: Julie Hensley - located at home Provider: Cleophas Dunker - located at [facility] Others (if applicable): none  Chief Complaint: cough, congestion, fevers  HPI:  Julie Hensley is a 60 y.o. female who PMH COPD who presents to discuss the following:  Woke up Saturday morning, felt very tired Congestion in chest Was in bed for two days Can't cough anything up No shortness of breath at present, but breathing is worse than usual, using inhalers Subjective fevers, chills Has decreased fluid intake Her ribs are hurting Has urinated twice in last twice 24 hrs Most recent time was very dark Overall feels worse than she usually does when she is sick Hasn't been tested for COVID   ROS: per HPI  Pertinent PMHx:  HTN, COPD, tobacco abuse, anxiety state  Exam:  There were no vitals taken for this visit.  Respiratory: Speaking in complete sentences, intermittent coughing, sounds somewhat ill over the phone, but no acute distress  Assessment/Plan:  Chest congestion Concern for COPD exacerbation, cannot rule out COVID as a cause.  Also concerned for dehydration given significantly decreased urination.  Given this, especially with her intermittent worsening in breathing, recommended evaluation of patient in-person today.  No in-person appointments available in [facility].  Recommended UC eval or ED.  Patient is agreeable.  Advised if worsens significantly or difficulty breathing, needs to call EMS right away, she voiced understanding.  Would consider CXR and BMP at least as part of workup given concern for dehydration and her recurrent COPD exacerbations to r/o pneumonia.    Time spent during visit with patient: 12 minutes

## 2020-09-18 ENCOUNTER — Ambulatory Visit (HOSPITAL_COMMUNITY)
Admission: RE | Admit: 2020-09-18 | Discharge: 2020-09-18 | Disposition: A | Discharge status: Home / Self Care | Payer: 59 | Source: Ambulatory Visit | Attending: Physician Assistant | Admitting: Physician Assistant

## 2020-09-18 ENCOUNTER — Ambulatory Visit (INDEPENDENT_AMBULATORY_CARE_PROVIDER_SITE_OTHER): Payer: 59

## 2020-09-18 ENCOUNTER — Other Ambulatory Visit: Payer: Self-pay

## 2020-09-18 ENCOUNTER — Encounter (HOSPITAL_COMMUNITY): Payer: Self-pay

## 2020-09-18 VITALS — BP 132/84 | HR 92 | Temp 98.3°F | Resp 24

## 2020-09-18 DIAGNOSIS — R059 Cough, unspecified: Secondary | ICD-10-CM

## 2020-09-18 DIAGNOSIS — N3001 Acute cystitis with hematuria: Secondary | ICD-10-CM | POA: Diagnosis not present

## 2020-09-18 DIAGNOSIS — T3695XA Adverse effect of unspecified systemic antibiotic, initial encounter: Secondary | ICD-10-CM | POA: Insufficient documentation

## 2020-09-18 DIAGNOSIS — B379 Candidiasis, unspecified: Secondary | ICD-10-CM | POA: Insufficient documentation

## 2020-09-18 DIAGNOSIS — J441 Chronic obstructive pulmonary disease with (acute) exacerbation: Secondary | ICD-10-CM | POA: Diagnosis not present

## 2020-09-18 LAB — POCT URINALYSIS DIPSTICK, ED / UC
Glucose, UA: NEGATIVE mg/dL
Nitrite: POSITIVE — AB
Protein, ur: 30 mg/dL — AB
Specific Gravity, Urine: >=1.03 (ref 1.005–1.030)
Urobilinogen, UA: 1 mg/dL (ref 0.0–1.0)
pH: 5.5 (ref 5.0–8.0)

## 2020-09-18 MED ORDER — PREDNISONE 10 MG (21) PO TBPK: ORAL_TABLET | ORAL | 0 refills | Status: DC

## 2020-09-18 MED ORDER — FLUCONAZOLE 150 MG PO TABS: 150.0000 mg | ORAL_TABLET | Freq: Every day | ORAL | 0 refills | Status: DC

## 2020-09-18 MED ORDER — AMOXICILLIN-POT CLAVULANATE 875-125 MG PO TABS: 1.0000 | ORAL_TABLET | Freq: Two times a day (BID) | ORAL | 0 refills | Status: DC

## 2020-09-18 NOTE — ED Triage Notes (Signed)
Pt reports cough, chest congestion, back pain, hedache x 6 days. Albuterol inhaler gives no relief.   Pt reports she was told by her PCP to be seeing at the [facility] for chest xray.   Pt reports lower abdominal pain, discomfort when urinating x 1 day. Pt reports she had 1 episode of orange urine yesterday.

## 2020-09-18 NOTE — ED Provider Notes (Addendum)
[place]    CSN: [id number] Arrival date & time: 09/18/20  1438      History   Chief Complaint Chief Complaint  Patient presents with  . Appointment    1500  . Cough    HPI Julie Hensley is a 60 y.o. female.   Patient presents today with a weeklong history of worsening productive cough.  She does have a history of COPD and has been taking inhalers as prescribed without improvement of symptoms.  Reports associated shortness of breath.  Denies any fever, nausea, vomiting, chest pain, dizziness, syncope.  She has recently developed a darkening of her urine with associated urinary urgency and is unsure if this is related to coughing or UTI.  She denies any recent antibiotic use.  She has had COVID-19 vaccinations.  She contacted her PCP who recommended she be evaluated and consider chest x-ray to rule out pneumonia.  She is a current smoker but has decreased her number of cigarettes to only 3/day due to illness.  She is having difficulty with daily activities as a result of symptoms.     Past Medical History:  Diagnosis Date  . Acid reflux   . Anxiety   . Arthritis   . Complication of anesthesia   . COPD (chronic obstructive pulmonary disease) ([place])   . Hypertension    HBP at [employer] machine but never been to doctor for it.   Marland Kitchen PONV (postoperative nausea and vomiting)     Patient Active Problem List   Diagnosis Date Noted  . Chest congestion 09/16/2020  . Acute bacterial sinusitis 07/15/2020  . Cognitive impairment 07/02/2020  . Dry cough 06/18/2020  . COPD (chronic obstructive pulmonary disease) ([place]) 12/20/2019  . Thrush 12/20/2019  . Insomnia 06/10/2014  . Essential hypertension 06/10/2014  . Tobacco abuse 06/10/2014  . Anxiety state 06/10/2014  . Esophageal reflux 06/10/2014    Past Surgical History:  Procedure Laterality Date  . APPENDECTOMY    . MANDIBLE FRACTURE SURGERY    . TONSILLECTOMY      OB History    Gravida  1   Para  1   Term   1   Preterm  0   AB  0   Living  1     SAB  0   IAB  0   Ectopic  0   Multiple  0   Live Births  1            Home Medications    Prior to Admission medications   Medication Sig Start Date End Date Taking? Authorizing Provider  amoxicillin-clavulanate (AUGMENTIN) 875-125 MG tablet Take 1 tablet by mouth every 12 (twelve) hours. 09/18/20  Yes Izaias Krupka K, PA-C  fluconazole (DIFLUCAN) 150 MG tablet Take 1 tablet (150 mg total) by mouth daily. 09/18/20  Yes Arkel Cartwright K, PA-C  predniSONE (STERAPRED UNI-PAK 21 TAB) 10 MG (21) TBPK tablet As directed. 09/18/20  Yes Louis Ivery, Derry Skill, PA-C  acetaminophen (TYLENOL) 500 MG tablet Take 1,000-1,500 mg by mouth daily as needed for headache.    [provider]  albuterol (VENTOLIN HFA) 108 (90 Base) MCG/ACT inhaler TAKE 2 PUFFS INTO LUNGS EVERY 4 HOURS AS NEEDED 06/19/20   Meccariello, Bernita Raisin, DO  Fluticasone-Salmeterol (WIXELA INHUB) 100-50 MCG/DOSE AEPB Inhale 1 puff into the lungs in the morning and at bedtime. 04/03/20   Meccariello, Bernita Raisin, DO  hydrochlorothiazide (HYDRODIURIL) 25 MG tablet Take 25 mg by mouth daily.    [provider]  hydrOXYzine (ATARAX/VISTARIL) 10 MG tablet TAKE 1 TABLET (10 MG TOTAL) BY MOUTH AT BEDTIME AS NEEDED (INSOMNIA). 08/27/20   Meccariello, Bernita Raisin, DO  gabapentin (NEURONTIN) 300 MG capsule Take three by mouth qhs 12/22/14 12/02/19  Dickie La, MD  omeprazole (PRILOSEC) 40 MG capsule Take 1 capsule (40 mg total) by mouth daily. 11/10/14 12/02/19  Dickie La, MD    Family History Family History  Problem Relation Age of Onset  . Hypertension Mother   . Heart disease Mother   . COPD Mother   . Hypertension Father   . Stroke Father   . Hypertension Brother   . Diabetes Paternal Grandmother   . Anesthesia problems Neg Hx     Social History Social History   Tobacco Use  . Smoking status: Current Every Day Smoker    Packs/day: 0.50    Years: 40.00    Pack years: 20.00     Types: Cigarettes  . Smokeless tobacco: Never Used  Substance Use Topics  . Alcohol use: No    Alcohol/week: 0.0 standard drinks    Comment: has not had alcohol in a month.  Previously a case of beer daily  . Drug use: No     Allergies   Patient has no known allergies.   Review of Systems Review of Systems  Constitutional: Negative for activity change, appetite change, fatigue and fever.  Respiratory: Positive for cough and shortness of breath.   Cardiovascular: Negative for chest pain.  Gastrointestinal: Negative for abdominal pain, diarrhea, nausea and vomiting.  Genitourinary: Positive for frequency and urgency. Negative for dysuria.  Musculoskeletal: Negative for arthralgias and myalgias.  Neurological: Positive for headaches. Negative for dizziness and light-headedness.     Physical Exam Triage Vital Signs ED Triage Vitals  Enc Vitals Group     BP 09/18/20 1512 132/84     Pulse Rate 09/18/20 1512 92     Resp 09/18/20 1512 (!) 24     Temp 09/18/20 1512 98.3 F (36.8 C)     Temp Source 09/18/20 1512 Oral     SpO2 09/18/20 1512 97 %     Weight --      Height --      Head Circumference --      Peak Flow --      Pain Score 09/18/20 1509 8     Pain Loc --      Pain Edu? --      Excl. in [place]? --    No data found.  Updated Vital Signs BP 132/84 (BP Location: Left Arm)   Pulse 92   Temp 98.3 F (36.8 C) (Oral)   Resp (!) 24   SpO2 97%   Visual Acuity Right Eye Distance:   Left Eye Distance:   Bilateral Distance:    Right Eye Near:   Left Eye Near:    Bilateral Near:     Physical Exam Vitals reviewed.  Constitutional:      General: She is awake. She is not in acute distress.    Appearance: Normal appearance. She is not ill-appearing.     Comments: Very pleasant female appears stated age in no acute distress  HENT:     Head: Normocephalic and atraumatic.     Right Ear: Tympanic membrane, ear canal and external ear normal. Tympanic membrane is not  erythematous or bulging.     Left Ear: Tympanic membrane, ear canal and external ear normal. Tympanic membrane is not erythematous or bulging.  Nose:     Right Sinus: No maxillary sinus tenderness or frontal sinus tenderness.     Left Sinus: No maxillary sinus tenderness or frontal sinus tenderness.     Mouth/Throat:     Pharynx: Uvula midline. No oropharyngeal exudate or posterior oropharyngeal erythema.  Cardiovascular:     Rate and Rhythm: Normal rate and regular rhythm.     Heart sounds: No murmur heard.     Comments: Clear to auscultation bilaterally Pulmonary:     Effort: Pulmonary effort is normal.     Breath sounds: Wheezing and rhonchi present. No rales.     Comments: Scattered wheezes and rhonchi that clear with cough Abdominal:     General: Bowel sounds are normal.     Palpations: Abdomen is soft.     Tenderness: There is no abdominal tenderness. There is no right CVA tenderness, left CVA tenderness, guarding or rebound.  Musculoskeletal:     Right lower leg: No edema.     Left lower leg: No edema.  Lymphadenopathy:     Head:     Right side of head: No submental, submandibular or tonsillar adenopathy.     Left side of head: No submental, submandibular or tonsillar adenopathy.     Cervical: No cervical adenopathy.  Psychiatric:        Behavior: Behavior is cooperative.      UC Treatments / Results  Labs (all labs ordered are listed, but only abnormal results are displayed) Labs Reviewed  POCT URINALYSIS DIPSTICK, ED / UC - Abnormal; Notable for the following components:      Result Value   Bilirubin Urine MODERATE (*)    Ketones, ur TRACE (*)    Hgb urine dipstick MODERATE (*)    Protein, ur 30 (*)    Nitrite POSITIVE (*)    Leukocytes,Ua TRACE (*)    All other components within normal limits  URINE CULTURE    EKG   Radiology DG Chest 2 View  Result Date: 09/18/2020 CLINICAL DATA:  Productive cough for 1 week EXAM: CHEST - 2 VIEW COMPARISON:   12/16/2014 FINDINGS: The heart size and mediastinal contours are within normal limits. Both lungs are clear. The visualized skeletal structures are unremarkable. IMPRESSION: No active cardiopulmonary disease. Electronically Signed   By: Inez Catalina M.D.   On: 09/18/2020 15:54    Procedures Procedures (including critical care time)  Medications Ordered in UC Medications - No data to display  Initial Impression / Assessment and Plan / UC Course  I have reviewed the triage vital signs and the nursing notes.  Pertinent labs & imaging results that were available during my care of the patient were reviewed by me and considered in my medical decision making (see chart for details).     Chest x-ray showed no acute findings.  UA did show evidence of infection.  We will start Augmentin to cover for COPD exacerbation and UTI.  No indication for dose adjustment based on CMP from 07/02/2020 with creatinine of 0.9 and calculated creatinine clearance of 73.3 mL/min.  Urine culture obtained and will determine if additional treatment is required based on laboratory findings.  She was prescribed prednisone taper with instruction not to take NSAIDs with this medication due to risk of GI bleeding.  She is to continue inhalers as prescribed by PCP.  Patient reported often getting yeast infections with antibiotics and so was given prescription for Diflucan.  Recommended she use Mucinex and Flonase for symptom relief.  Discussed alarm symptoms  that would warrant emergent evaluation.  Strict return precautions given to which patient expressed understanding.  Final Clinical Impressions(s) / UC Diagnoses   Final diagnoses:  COPD exacerbation ([place])  Cough  Acute cystitis with hematuria  Antibiotic-induced yeast infection     Discharge Instructions     I have started you on Augmentin to cover for COPD exacerbation as well as urinary tract infection.  I have also called in prednisone.  You should slowly decrease the  prednisone dose as directed by the packaging.  Do not take NSAIDs including aspirin, ibuprofen/Advil, naproxen/Aleve with this medication due to risk of GI bleeding.  Continue your inhalers as prescribed.  If anything worsens please return for reevaluation.  I have called in a yeast infection medication.  If anything worsens please return for reevaluation.  If your symptoms improve, please follow-up with your PCP within 1 month to recheck your urine to make sure that blood noted on dipstick has resolved with treatment of infection.    ED Prescriptions    Medication Sig Dispense Auth. Provider   amoxicillin-clavulanate (AUGMENTIN) 875-125 MG tablet Take 1 tablet by mouth every 12 (twelve) hours. 14 tablet Ceasia Elwell K, PA-C   predniSONE (STERAPRED UNI-PAK 21 TAB) 10 MG (21) TBPK tablet As directed. 21 tablet Nimrat Woolworth K, PA-C   fluconazole (DIFLUCAN) 150 MG tablet Take 1 tablet (150 mg total) by mouth daily. 1 tablet Laveda Demedeiros, Derry Skill, PA-C     PDMP not reviewed this encounter.   Terrilee Croak, PA-C 09/18/20 1608    Stacye Noori, Derry Skill, PA-C 09/18/20 1614

## 2020-09-18 NOTE — Discharge Instructions (Addendum)
I have started you on Augmentin to cover for COPD exacerbation as well as urinary tract infection.  I have also called in prednisone.  You should slowly decrease the prednisone dose as directed by the packaging.  Do not take NSAIDs including aspirin, ibuprofen/Advil, naproxen/Aleve with this medication due to risk of GI bleeding.  Continue your inhalers as prescribed.  If anything worsens please return for reevaluation.  I have called in a yeast infection medication.  If anything worsens please return for reevaluation.  If your symptoms improve, please follow-up with your PCP within 1 month to recheck your urine to make sure that blood noted on dipstick has resolved with treatment of infection.

## 2020-09-19 ENCOUNTER — Other Ambulatory Visit: Payer: Self-pay | Admitting: Family Medicine

## 2020-09-19 DIAGNOSIS — J449 Chronic obstructive pulmonary disease, unspecified: Secondary | ICD-10-CM

## 2020-09-20 ENCOUNTER — Encounter: Payer: Self-pay | Admitting: Family Medicine

## 2020-09-21 ENCOUNTER — Telehealth (HOSPITAL_COMMUNITY): Payer: Self-pay | Admitting: Emergency Medicine

## 2020-09-21 LAB — URINE CULTURE: Culture: >=100000 — AB

## 2020-09-21 MED ORDER — CEPHALEXIN 500 MG PO CAPS: 500.0000 mg | ORAL_CAPSULE | Freq: Two times a day (BID) | ORAL | 0 refills | Status: AC

## 2020-09-21 NOTE — Telephone Encounter (Signed)
Change in Antibiotics for UTI, per Dr. Lanny Cramp

## 2020-10-20 ENCOUNTER — Other Ambulatory Visit: Payer: Self-pay

## 2020-10-20 DIAGNOSIS — J449 Chronic obstructive pulmonary disease, unspecified: Secondary | ICD-10-CM

## 2020-10-20 MED ORDER — ALBUTEROL SULFATE HFA 108 (90 BASE) MCG/ACT IN AERS: INHALATION_SPRAY | RESPIRATORY_TRACT | 2 refills | Status: DC

## 2020-10-29 ENCOUNTER — Other Ambulatory Visit: Payer: Self-pay | Admitting: Family Medicine

## 2020-10-29 DIAGNOSIS — F411 Generalized anxiety disorder: Secondary | ICD-10-CM

## 2020-10-30 ENCOUNTER — Encounter (HOSPITAL_COMMUNITY): Payer: Self-pay | Admitting: Psychiatry

## 2020-10-30 ENCOUNTER — Ambulatory Visit (INDEPENDENT_AMBULATORY_CARE_PROVIDER_SITE_OTHER): Payer: 59 | Admitting: Psychiatry

## 2020-10-30 DIAGNOSIS — F32A Depression, unspecified: Secondary | ICD-10-CM

## 2020-10-30 DIAGNOSIS — F320 Major depressive disorder, single episode, mild: Secondary | ICD-10-CM

## 2020-10-30 DIAGNOSIS — F411 Generalized anxiety disorder: Secondary | ICD-10-CM | POA: Diagnosis not present

## 2020-10-30 DIAGNOSIS — G4700 Insomnia, unspecified: Secondary | ICD-10-CM | POA: Diagnosis not present

## 2020-10-30 MED ORDER — HYDROXYZINE HCL 25 MG PO TABS: 25.0000 mg | ORAL_TABLET | Freq: Every evening | ORAL | 2 refills | Status: DC | PRN

## 2020-10-30 MED ORDER — QUETIAPINE FUMARATE 25 MG PO TABS: 25.0000 mg | ORAL_TABLET | Freq: Two times a day (BID) | ORAL | 2 refills | Status: DC
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
## 2020-11-03 ENCOUNTER — Institutional Professional Consult (permissible substitution): Payer: 59 | Admitting: Pulmonary Disease

## 2020-11-04 ENCOUNTER — Institutional Professional Consult (permissible substitution): Payer: 59 | Admitting: Pulmonary Disease

## 2020-11-04 ENCOUNTER — Telehealth (HOSPITAL_COMMUNITY): Payer: Self-pay | Admitting: Psychiatry

## 2020-11-04 NOTE — Telephone Encounter (Signed)
Pt calling to report SEROQUEL 25 MG "is not going to work for me".  Pt requesting return call after she gets home from work tomorrow 11/05/20. Pt says she gets off work at 2:30 and home at 3:00 pm.  Confirmed [phone] for return call.

## 2020-11-05 ENCOUNTER — Telehealth (HOSPITAL_COMMUNITY): Payer: Self-pay | Admitting: *Deleted

## 2020-11-05 ENCOUNTER — Other Ambulatory Visit (HOSPITAL_COMMUNITY): Payer: Self-pay | Admitting: Psychiatry

## 2020-11-05 DIAGNOSIS — F5105 Insomnia due to other mental disorder: Secondary | ICD-10-CM

## 2020-11-05 DIAGNOSIS — F411 Generalized anxiety disorder: Secondary | ICD-10-CM

## 2020-11-05 MED ORDER — MIRTAZAPINE 7.5 MG PO TABS: 7.5000 mg | ORAL_TABLET | Freq: Every day | ORAL | 2 refills | Status: DC

## 2020-11-05 NOTE — Telephone Encounter (Signed)
Provider spoke to patient.  She notes that Seroquel increased anxiety and did not effectively help her sleep.  She was agreeable today to start mirtazapine 7.5 mg nightly to help manage sleep, anxiety, and depression.

## 2020-11-05 NOTE — Telephone Encounter (Signed)
Call from patient to report how she is feeling since starting the Seroquel on fri. Reports it has not helped with her sleep or her anxiety and she has had negative effects of excessive sweating, feeling like a zombie in the am when she wakes up and crying for no reason. She has stopped taking the Seroquel due to this S/E but now would like to speak with Ms Ronne Binning. Will pass her concern on to the provider.

## 2020-11-05 NOTE — Telephone Encounter (Signed)
Patient informed Probation officer that the Seroquel makes her more anxious and does not effectively manage her sleep.  She inform Probation officer that she is anxious about her job and her mother who is sick.  She notes that most nights she wakes up at times has panic attacks.  Today she is agreeable to starting mirtazapine 7.5 mg to help manage sleep and anxiety. Potential side effects of medication and risks vs benefits of treatment vs non-treatment were explained and discussed. All questions were answered.  No other concerns noted at this time.

## 2020-11-10 ENCOUNTER — Telehealth (HOSPITAL_COMMUNITY): Payer: Self-pay | Admitting: Psychiatry

## 2020-11-10 NOTE — Telephone Encounter (Signed)
Patient calling to report increased panic and anxiety. Pt says she does not do well with any depression medication. Pt says she does not need treatment for depression she says she needs treatment for anxiety. Please advise.

## 2020-11-11 ENCOUNTER — Other Ambulatory Visit (HOSPITAL_COMMUNITY): Payer: Self-pay | Admitting: Psychiatry

## 2020-11-11 DIAGNOSIS — F411 Generalized anxiety disorder: Secondary | ICD-10-CM

## 2020-11-11 DIAGNOSIS — F99 Mental disorder, not otherwise specified: Secondary | ICD-10-CM

## 2020-11-11 DIAGNOSIS — F5105 Insomnia due to other mental disorder: Secondary | ICD-10-CM

## 2020-11-11 MED ORDER — GABAPENTIN 300 MG PO CAPS: 300.0000 mg | ORAL_CAPSULE | Freq: Three times a day (TID) | ORAL | 2 refills | Status: DC

## 2020-11-11 MED ORDER — TRAZODONE HCL 50 MG PO TABS
50.0000 mg | ORAL_TABLET | Freq: Every evening | ORAL | 2 refills | Status: DC | PRN
Start: 2020-11-11 — End: 2020-12-03

## 2020-11-11 NOTE — Telephone Encounter (Signed)
Patient informed writer that mirtazapine has been ineffective.  She notes that in the morning she feels tired and has difficulties getting her self going.  She notes that she also woke up with a panic attack while on mirtazapine and notes that it is not effective in managing her sleep.  At this time she wants to discontinue it.  She notes that trazodone was somewhat effective in the past and would like to restart it.  Patient notes that she is concerned that her anxiety is not being well managed.  She informed Probation officer that she is anxious most days due to life stressors such as work.  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  Provider asked patient if she wants therapy to help manage life stressors.  She notes that she does not.  She notes that she prefers Xanax or Klonopin as its been effective in the past.  Provider informed patient that these medications would not be prescribed at this time.  She notes that she does not want to try another antidepressant.  Provider discussed buspar, propanolol or gabapentin to help manage her anxiety.  At this time she notes that she would like to try gabapentin.  She did Artist that she has chronic pain over her general body.  She notes that her legs gives her the most pain.  Potential side effects of medication and risks vs benefits of treatment vs non-treatment were explained and discussed. All questions were answered.  No other concerns noted at this time.

## 2020-12-03 ENCOUNTER — Other Ambulatory Visit (HOSPITAL_COMMUNITY): Payer: Self-pay | Admitting: Psychiatry

## 2020-12-03 DIAGNOSIS — F99 Mental disorder, not otherwise specified: Secondary | ICD-10-CM

## 2020-12-03 DIAGNOSIS — F5105 Insomnia due to other mental disorder: Secondary | ICD-10-CM

## 2020-12-03 DIAGNOSIS — F411 Generalized anxiety disorder: Secondary | ICD-10-CM

## 2020-12-09 ENCOUNTER — Ambulatory Visit (INDEPENDENT_AMBULATORY_CARE_PROVIDER_SITE_OTHER): Payer: 59 | Admitting: Pulmonary Disease

## 2020-12-09 ENCOUNTER — Encounter: Payer: Self-pay | Admitting: Pulmonary Disease

## 2020-12-09 ENCOUNTER — Other Ambulatory Visit: Payer: Self-pay

## 2020-12-09 VITALS — BP 124/72 | HR 83 | Temp 99.1°F | Ht 63.0 in | Wt 153.2 lb

## 2020-12-09 DIAGNOSIS — F17210 Nicotine dependence, cigarettes, uncomplicated: Secondary | ICD-10-CM | POA: Diagnosis not present

## 2020-12-09 DIAGNOSIS — Z122 Encounter for screening for malignant neoplasm of respiratory organs: Secondary | ICD-10-CM

## 2020-12-09 DIAGNOSIS — Z720 Tobacco use: Secondary | ICD-10-CM

## 2020-12-09 DIAGNOSIS — J449 Chronic obstructive pulmonary disease, unspecified: Secondary | ICD-10-CM | POA: Diagnosis not present

## 2020-12-09 MED ORDER — TRELEGY ELLIPTA 200-62.5-25 MCG/INH IN AEPB: 1.0000 | INHALATION_SPRAY | Freq: Every day | RESPIRATORY_TRACT | 11 refills | Status: DC

## 2020-12-09 NOTE — Patient Instructions (Signed)
Ce to meet you  Use trelegy 1 puff daily - rinse out mouth after every use  Stop Advair once you receive Trelegy  We will get breathing tests to see if you have COPD or not  I ordered a CT scan for lung cancer screening as we discussed today  Return to clinic for follow up in 3 months or sooner as needed

## 2020-12-17 ENCOUNTER — Ambulatory Visit (INDEPENDENT_AMBULATORY_CARE_PROVIDER_SITE_OTHER)
Admission: RE | Admit: 2020-12-17 | Discharge: 2020-12-17 | Disposition: A | Discharge status: Home / Self Care | Payer: 59 | Source: Ambulatory Visit | Attending: Pulmonary Disease | Admitting: Pulmonary Disease

## 2020-12-17 ENCOUNTER — Other Ambulatory Visit: Payer: Self-pay

## 2020-12-17 DIAGNOSIS — Z87891 Personal history of nicotine dependence: Secondary | ICD-10-CM

## 2020-12-17 DIAGNOSIS — Z720 Tobacco use: Secondary | ICD-10-CM

## 2020-12-17 DIAGNOSIS — Z122 Encounter for screening for malignant neoplasm of respiratory organs: Secondary | ICD-10-CM

## 2020-12-21 ENCOUNTER — Telehealth: Payer: Self-pay | Admitting: Pulmonary Disease

## 2020-12-21 DIAGNOSIS — J449 Chronic obstructive pulmonary disease, unspecified: Secondary | ICD-10-CM

## 2020-12-21 MED ORDER — ALBUTEROL SULFATE HFA 108 (90 BASE) MCG/ACT IN AERS: 2.0000 | INHALATION_SPRAY | Freq: Four times a day (QID) | RESPIRATORY_TRACT | 6 refills | Status: DC | PRN

## 2020-12-21 MED ORDER — TRELEGY ELLIPTA 200-62.5-25 MCG/INH IN AEPB: 1.0000 | INHALATION_SPRAY | Freq: Every day | RESPIRATORY_TRACT | 11 refills | Status: DC

## 2020-12-21 NOTE — Telephone Encounter (Signed)
Called and spoke with pt letting her know that I was going to send her inhalers to preferred pharmacy and she verbalized understanding. Both have been sent. Nothing further needed.

## 2020-12-21 NOTE — Telephone Encounter (Signed)
Pt called and she is concerned about the findings about her aorta on the recent CT scan.  [place] Please advise.  CT was done on 08/18  Thanks

## 2020-12-28 NOTE — Telephone Encounter (Signed)
Pt notified of response per MD  She verbalized understanding  I scheduled her for pft and ov  Nothing further eeded

## 2020-12-28 NOTE — Telephone Encounter (Signed)
Lm for patient.  

## 2020-12-28 NOTE — Telephone Encounter (Signed)
Patient is returning phone call. Patient phone number is [phone].

## 2020-12-29 NOTE — Progress Notes (Signed)
$'@Patient'e$  ID: Julie Hensley, female    DOB: [birth year], 60 y.o.   MRN: [id number]  Chief Complaint  Patient presents with   Consult    Copd,     Referring provider: Kinnie Feil, MD  HPI:   60 y.o. woman whom we are seeing in consultation for evaluation of dyspnea on exertion, possible COPD.  PCP note reviewed.  Patient with dyspnea on exertion.  Worse with inclines or stairs.  She is on various inhalers.  Told she has COPD based on what sounds like imaging findings in the past.  She currently uses Advair/Wixela as prescribed.  Does not think it helps very much.  No time of day where breathing better or worse.  She got the device seasonal environmental factors make things better or worse.  No other relieving or exacerbating factors.  In the she was seen in the emergency room 09/18/2020.  That note is reviewed.  She was diagnosed with a UTI and COPD exacerbation given her symptoms.  She was given Augmentin and prednisone.  Chest x-ray reviewed that encounter 08/2020 reveals clear lungs with mild hyperinflation on my interpretation.  Reviewed CT scan August/2016 the monitor data shows mild emphysema in the apices, bibasilar linear atelectasis.  Do not see any record of pulmonary function test performed.  She does not think she is ever had these done.  PMH: Tobacco abuse Surgical history: Appendectomy, tonsillectomy Family history: Mother with hypertension, CAD, COPD, father with hypertension, CVA Social history: Current smoker about 20 pack years, lives in [place] / Pulmonary Flowsheets:   ACT:  No flowsheet data found.  MMRC: No flowsheet data found.  Epworth:  No flowsheet data found.  Tests:   FENO:  No results found for: NITRICOXIDE  PFT: No flowsheet data found.  WALK:  No flowsheet data found.  Imaging: Personally reviewed and as per EMR discussion of this note  Lab Results: Personally reviewed CBC    Component Value Date/Time   WBC 9.5  07/02/2020 1359   WBC 10.0 12/16/2014 1850   RBC 4.42 07/02/2020 1359   RBC 5.23 (H) 12/16/2014 1850   HGB 12.6 07/02/2020 1359   HCT 38.2 07/02/2020 1359   PLT 328 07/02/2020 1359   MCV 86 07/02/2020 1359   MCH 28.5 07/02/2020 1359   MCH 30.4 12/16/2014 1850   MCHC 33.0 07/02/2020 1359   MCHC 34.9 12/16/2014 1850   RDW 13.6 07/02/2020 1359   LYMPHSABS 1.2 07/02/2020 1359   MONOABS 0.4 06/10/2014 1015   EOSABS 0.2 07/02/2020 1359   BASOSABS 0.0 07/02/2020 1359    BMET    Component Value Date/Time   NA 141 07/02/2020 1359   K 4.5 07/02/2020 1359   CL 104 07/02/2020 1359   CO2 19 (L) 07/02/2020 1359   GLUCOSE 96 07/02/2020 1359   GLUCOSE 132 (H) 12/16/2014 1923   BUN 6 (L) 07/02/2020 1359   CREATININE 0.90 07/02/2020 1359   CREATININE 0.75 06/10/2014 1015   CALCIUM 9.8 07/02/2020 1359   GFRNONAA 37 (L) 12/16/2014 1850   GFRNONAA >89 06/10/2014 1015   GFRAA 43 (L) 12/16/2014 1850   GFRAA >89 06/10/2014 1015    BNP No results found for: BNP  ProBNP No results found for: PROBNP  Specialty Problems       Pulmonary Problems   COPD (chronic obstructive pulmonary disease) (HCC)   Dry cough   Acute bacterial sinusitis   Chest congestion    No Known Allergies  Immunization History  Administered Date(s) Administered   Moderna Sars-Covid-2 Vaccination 01/01/2020, 01/29/2020    Past Medical History:  Diagnosis Date   Acid reflux    Anxiety    Arthritis    Complication of anesthesia    COPD (chronic obstructive pulmonary disease) (HCC)    Hypertension    HBP at [employer] machine but never been to doctor for it.    PONV (postoperative nausea and vomiting)     Tobacco History: Social History   Tobacco Use  Smoking Status Every Day   Packs/day: 0.50   Years: 40.00   Pack years: 20.00   Types: Cigarettes  Smokeless Tobacco Never  Tobacco Comments   0.5 ppd 12/09/2020   Ready to quit: Not Answered Counseling given: Not Answered Tobacco comments: 0.5  ppd 12/09/2020   Continue to not smoke  Outpatient Encounter Medications as of 12/09/2020  Medication Sig   acetaminophen (TYLENOL) 500 MG tablet Take 1,000-1,500 mg by mouth daily as needed for headache.   hydrochlorothiazide (HYDRODIURIL) 25 MG tablet Take 25 mg by mouth daily.   traZODone (DESYREL) 50 MG tablet TAKE 1 TABLET BY MOUTH AT BEDTIME AS NEEDED FOR SLEEP.   [DISCONTINUED] albuterol (VENTOLIN HFA) 108 (90 Base) MCG/ACT inhaler INHALE 2 PUFFS INTO LUNGS EVERY 4 HOURS AS NEEDED   [DISCONTINUED] fluticasone-salmeterol (ADVAIR) 100-50 MCG/ACT AEPB Inhale 1 puff into the lungs 2 (two) times daily.   [DISCONTINUED] Fluticasone-Umeclidin-Vilant (TRELEGY ELLIPTA) 200-62.5-25 MCG/INH AEPB Inhale 1 puff into the lungs daily.   [DISCONTINUED] omeprazole (PRILOSEC) 40 MG capsule Take 1 capsule (40 mg total) by mouth daily.   amoxicillin-clavulanate (AUGMENTIN) 875-125 MG tablet Take 1 tablet by mouth every 12 (twelve) hours.   fluconazole (DIFLUCAN) 150 MG tablet Take 1 tablet (150 mg total) by mouth daily.   gabapentin (NEURONTIN) 300 MG capsule Take 1 capsule (300 mg total) by mouth 3 (three) times daily.   hydrOXYzine (ATARAX/VISTARIL) 25 MG tablet Take 1 tablet (25 mg total) by mouth at bedtime as needed (insomnia).   predniSONE (STERAPRED UNI-PAK 21 TAB) 10 MG (21) TBPK tablet As directed.   [DISCONTINUED] Fluticasone-Salmeterol (WIXELA INHUB) 100-50 MCG/DOSE AEPB Inhale 1 puff into the lungs in the morning and at bedtime.   No facility-administered encounter medications on file as of 12/09/2020.     Review of Systems  Review of Systems  No chest pain with exertion.  No orthopnea or PND.  No lower extremity swelling.  Conference review of systems otherwise negative. Physical Exam  BP 124/72 (BP Location: Left Arm, Patient Position: Sitting, Cuff Size: Normal)   Pulse 83   Temp 99.1 F (37.3 C) (Oral)   Ht '5\' 3"'$  (1.6 m)   Wt 153 lb 3.2 oz (69.5 kg)   SpO2 96%   BMI 27.14 kg/m    Wt Readings from Last 5 Encounters:  12/09/20 153 lb 3.2 oz (69.5 kg)  09/03/20 154 lb (69.9 kg)  08/05/20 156 lb (70.8 kg)  07/02/20 160 lb 6.4 oz (72.8 kg)  02/12/20 158 lb (71.7 kg)    BMI Readings from Last 5 Encounters:  12/09/20 27.14 kg/m  09/03/20 27.28 kg/m  08/05/20 27.63 kg/m  07/13/20 28.41 kg/m  07/02/20 28.41 kg/m     Physical Exam General: Well-appearing, sitting in chair Eyes: EOMI, icterus Neck: Supple no JVP Cardiovascular regular rhythm, no murmurs Pulmonary: Distant, normal work of breathing, no wheeze or crackle Abdomen: Nondistended, bowel sounds present MSK: No synovitis, joint effusion Neuro: Normal gait, no weakness Psych: Normal mood, full affect  Assessment & Plan:   Dyspnea on exertion: Previously diagnosed COPD but has never performed pulmonary function test.  She does have a 20-pack-year smoking history so this is on the differential.  Not well controlled on prior ICS/LABA therapy.  Albuterol somewhat helpful.  She reports multiple exacerbations requiring prednisone.  Given severity of symptoms and exacerbations, escalated triple therapy with high-dose Trelegy.  She is to stop her current Advair.  PFTs ordered for further evaluation.  Lung cancer screening: She qualifies for lung cancer given her age, 20-pack-year history, and ongoing cigarette use.  We discussed the risk and benefits of lung cancer screening.  We use decision aid [email or web address] and came to the shared decision to pursue lung cancer screening with yearly low-dose CT scan.  Order for this placed today.  Tobacco abuse: Discussed the risks of ongoing cigarette smoking including worsening breathing, development of COPD, cardiovascular risks, risk of pulmonary malignancy and other malignancies, and the benefits of total absence of cigarettes.  We discussed role of pharmacologic and nonpharmacologic means to achieve this goal.  We discussed gradual reduction of cigarettes over  time with goal for total abstinence.  Total time spent in counseling 5 minutes.   Return in about 3 months (around 03/11/2021).   Lanier Clam, MD 12/29/2020

## 2021-02-01 ENCOUNTER — Encounter (HOSPITAL_COMMUNITY): Payer: 59 | Admitting: Psychiatry

## 2021-03-04 ENCOUNTER — Ambulatory Visit (INDEPENDENT_AMBULATORY_CARE_PROVIDER_SITE_OTHER): Payer: 59 | Admitting: Pulmonary Disease

## 2021-03-04 ENCOUNTER — Other Ambulatory Visit: Payer: Self-pay

## 2021-03-04 ENCOUNTER — Encounter: Payer: Self-pay | Admitting: Pulmonary Disease

## 2021-03-04 VITALS — BP 130/72 | HR 75 | Ht 63.0 in | Wt 150.0 lb

## 2021-03-04 DIAGNOSIS — J4520 Mild intermittent asthma, uncomplicated: Secondary | ICD-10-CM | POA: Diagnosis not present

## 2021-03-04 DIAGNOSIS — J438 Other emphysema: Secondary | ICD-10-CM

## 2021-03-04 DIAGNOSIS — J449 Chronic obstructive pulmonary disease, unspecified: Secondary | ICD-10-CM

## 2021-03-04 DIAGNOSIS — J45909 Unspecified asthma, uncomplicated: Secondary | ICD-10-CM

## 2021-03-04 HISTORY — DX: Unspecified asthma, uncomplicated: J45.909

## 2021-03-04 LAB — PULMONARY FUNCTION TEST
DL/VA % pred: 89 %
DL/VA: 3.8 ml/min/mmHg/L
DLCO cor % pred: 89 %
DLCO cor: 17.47 ml/min/mmHg
DLCO unc % pred: 89 %
DLCO unc: 17.47 ml/min/mmHg
FEF 25-75 Post: 2.3 L/sec
FEF 25-75 Pre: 2.26 L/sec
FEF2575-%Change-Post: 1 %
FEF2575-%Pred-Post: 100 %
FEF2575-%Pred-Pre: 98 %
FEV1-%Change-Post: 1 %
FEV1-%Pred-Post: 98 %
FEV1-%Pred-Pre: 96 %
FEV1-Post: 2.42 L
FEV1-Pre: 2.38 L
FEV1FVC-%Change-Post: -1 %
FEV1FVC-%Pred-Pre: 100 %
FEV6-%Change-Post: 3 %
FEV6-%Pred-Post: 101 %
FEV6-%Pred-Pre: 98 %
FEV6-Post: 3.12 L
FEV6-Pre: 3.03 L
FEV6FVC-%Pred-Post: 103 %
FEV6FVC-%Pred-Pre: 103 %
FVC-%Change-Post: 3 %
FVC-%Pred-Post: 98 %
FVC-%Pred-Pre: 95 %
FVC-Post: 3.12 L
FVC-Pre: 3.03 L
Post FEV1/FVC ratio: 77 %
Post FEV6/FVC ratio: 100 %
Pre FEV1/FVC ratio: 79 %
Pre FEV6/FVC Ratio: 100 %
RV % pred: 73 %
RV: 1.43 L
TLC % pred: 91 %
TLC: 4.49 L

## 2021-03-04 NOTE — Patient Instructions (Signed)
Full PFT performed today. °

## 2021-03-04 NOTE — Progress Notes (Signed)
Full PFT performed today. °

## 2021-03-04 NOTE — Progress Notes (Signed)
@Patient  ID: Julie Hensley, female    DOB: 1961/05/01, 60 y.o.   MRN: [id number]  Chief Complaint  Patient presents with   Follow-up    53mo f/u with PFT. States she has been coughing and wheezing more.     Referring provider: Alcus Dad, MD  HPI:   31 y.o. woman whom we are seeing in follow-up for evaluation of dyspnea on exertion related to asthma.  Symptoms greatly improved with high-dose Trelegy.  PFTs 03/2021 normal without fixed obstruction, normal DLCO, normal lung volumes.  Overall patient doing quite well.  She states Trelegy high-dose has helped very much.  She was on 100 mcg dose in the past without much improvement.  Dyspnea better, cough better.  In addition, she [place] her [place] the [place].  Sounds like chemicals for cleaning etc. were causing exacerbations are triggering symptoms.  This is likely been very helpful in terms of better symptom control.  However she states that she had no symptoms on Trelegy prior to quitting job where she is exposed to triggers.  We reviewed at length her PFTs today.  Spirometry is normal without fixed obstruction, no bronchodilator response.  Lung volumes within normal limit.  DLCO within normal limits.  She was pleased by this.  She is switched insurance plan starting 05/2021.  Hopefully she will have access to Trelegy at a reasonable cost given tremendous therapeutic benefit.  HPI initial visit: Patient with dyspnea on exertion.  Worse with inclines or stairs.  She is on various inhalers.  Told she has COPD based on what sounds like imaging findings in the past.  She currently uses Advair/Wixela as prescribed.  Does not think it helps very much.  No time of day where breathing better or worse.  She got the device seasonal environmental factors make things better or worse.  No other relieving or exacerbating factors.  In the she was seen in the emergency room 09/18/2020.  That note is reviewed.  She was diagnosed with a UTI and COPD exacerbation  given her symptoms.  She was given Augmentin and prednisone.  Chest x-ray reviewed that encounter 08/2020 reveals clear lungs with mild hyperinflation on my interpretation.  Reviewed CT scan August/2016 the monitor data shows mild emphysema in the apices, bibasilar linear atelectasis.  Do not see any record of pulmonary function test performed.  She does not think she is ever had these done.  PMH: Tobacco abuse Surgical history: Appendectomy, tonsillectomy Family history: Mother with hypertension, CAD, COPD, father with hypertension, CVA Social history: Current smoker about 20 pack years, lives in [place] / Pulmonary Flowsheets:   ACT:  No flowsheet data found.  MMRC: No flowsheet data found.  Epworth:  No flowsheet data found.  Tests:   FENO:  No results found for: NITRICOXIDE  PFT: PFT Results Latest Ref Rng & Units 03/04/2021  FVC-Pre L 3.03  FVC-Predicted Pre % 95  FVC-Post L 3.12  FVC-Predicted Post % 98  Pre FEV1/FVC % % 79  Post FEV1/FCV % % 77  FEV1-Pre L 2.38  FEV1-Predicted Pre % 96  FEV1-Post L 2.42  DLCO uncorrected ml/min/mmHg 17.47  DLCO UNC% % 89  DLCO corrected ml/min/mmHg 17.47  DLCO COR %Predicted % 89  DLVA Predicted % 89  TLC L 4.49  TLC % Predicted % 91  RV % Predicted % 73  Personally reviewed and interpreted as normal spirometry, no bronchodilator spots, lung volumes within normal limits, DLCO within normal limits.  WALK:  No flowsheet data found.  Imaging: Personally reviewed and as per EMR discussion of this note  Lab Results: Personally reviewed CBC    Component Value Date/Time   WBC 9.5 07/02/2020 1359   WBC 10.0 12/16/2014 1850   RBC 4.42 07/02/2020 1359   RBC 5.23 (H) 12/16/2014 1850   HGB 12.6 07/02/2020 1359   HCT 38.2 07/02/2020 1359   PLT 328 07/02/2020 1359   MCV 86 07/02/2020 1359   MCH 28.5 07/02/2020 1359   MCH 30.4 12/16/2014 1850   MCHC 33.0 07/02/2020 1359   MCHC 34.9 12/16/2014 1850   RDW  13.6 07/02/2020 1359   LYMPHSABS 1.2 07/02/2020 1359   MONOABS 0.4 06/10/2014 1015   EOSABS 0.2 07/02/2020 1359   BASOSABS 0.0 07/02/2020 1359    BMET    Component Value Date/Time   NA 141 07/02/2020 1359   K 4.5 07/02/2020 1359   CL 104 07/02/2020 1359   CO2 19 (L) 07/02/2020 1359   GLUCOSE 96 07/02/2020 1359   GLUCOSE 132 (H) 12/16/2014 1923   BUN 6 (L) 07/02/2020 1359   CREATININE 0.90 07/02/2020 1359   CREATININE 0.75 06/10/2014 1015   CALCIUM 9.8 07/02/2020 1359   GFRNONAA 37 (L) 12/16/2014 1850   GFRNONAA >89 06/10/2014 1015   GFRAA 43 (L) 12/16/2014 1850   GFRAA >89 06/10/2014 1015    BNP No results found for: BNP  ProBNP No results found for: PROBNP  Specialty Problems       Pulmonary Problems   COPD (chronic obstructive pulmonary disease) (HCC)   Dry cough   Acute bacterial sinusitis   Chest congestion    No Known Allergies  Immunization History  Administered Date(s) Administered   Moderna Sars-Covid-2 Vaccination 01/01/2020, 01/29/2020    Past Medical History:  Diagnosis Date   Acid reflux    Anxiety    Arthritis    Complication of anesthesia    COPD (chronic obstructive pulmonary disease) ([facility])    Hypertension    HBP at [employer] machine but never been to doctor for it.    PONV (postoperative nausea and vomiting)     Tobacco History: Social History   Tobacco Use  Smoking Status Every Day   Packs/day: 0.50   Years: 40.00   Pack years: 20.00   Types: Cigarettes  Smokeless Tobacco Never  Tobacco Comments   0.5 ppd 12/09/2020   Ready to quit: Not Answered Counseling given: Not Answered Tobacco comments: 0.5 ppd 12/09/2020   Continue to not smoke  Outpatient Encounter Medications as of 03/04/2021  Medication Sig   acetaminophen (TYLENOL) 500 MG tablet Take 1,000-1,500 mg by mouth daily as needed for headache.   albuterol (VENTOLIN HFA) 108 (90 Base) MCG/ACT inhaler Inhale 2 puffs into the lungs every 6 (six) hours as needed for  wheezing or shortness of breath.   Fluticasone-Umeclidin-Vilant (TRELEGY ELLIPTA) 200-62.5-25 MCG/INH AEPB Inhale 1 puff into the lungs daily.   hydrochlorothiazide (HYDRODIURIL) 25 MG tablet Take 25 mg by mouth daily.   traZODone (DESYREL) 50 MG tablet TAKE 1 TABLET BY MOUTH AT BEDTIME AS NEEDED FOR SLEEP.   [DISCONTINUED] omeprazole (PRILOSEC) 40 MG capsule Take 1 capsule (40 mg total) by mouth daily.   No facility-administered encounter medications on file as of 03/04/2021.     Review of Systems  Review of Systems  N/a  Physical Exam  BP 130/72   Pulse 75   Ht 5\' 3"  (1.6 m)   Wt 150 lb (68 kg)   SpO2 97%  Comment: on RA  BMI 26.57 kg/m   Wt Readings from Last 5 Encounters:  03/04/21 150 lb (68 kg)  12/09/20 153 lb 3.2 oz (69.5 kg)  09/03/20 154 lb (69.9 kg)  08/05/20 156 lb (70.8 kg)  07/02/20 160 lb 6.4 oz (72.8 kg)    BMI Readings from Last 5 Encounters:  03/04/21 26.57 kg/m  12/09/20 27.14 kg/m  09/03/20 27.28 kg/m  08/05/20 27.63 kg/m  07/13/20 28.41 kg/m     Physical Exam General: Well-appearing, sitting in chair Eyes: EOMI, icterus Neck: Supple no JVP Cardiovascular regular rhythm, no murmurs Pulmonary: Distant, normal work of breathing, no wheeze or crackle Abdomen: Nondistended, bowel sounds present MSK: No synovitis, joint effusion Neuro: Normal gait, no weakness Psych: Normal mood, full affect   Assessment & Plan:   Dyspnea on exertion due to asthma: PFTs normal without fixed obstruction.  No gas trapping, DLCO within normal limits.  Environmental triggers, atopic symptoms.  Improved with high-dose Trelegy.  Related to asthma.  Symptoms improved on Trelegy.  Lung cancer screening: Lung RADS 2 11/2020, due for repeat 11/2021.  Emphysema: Related to cigarette smoking.   Return in about 6 months (around 09/01/2021).   Lanier Clam, MD 03/04/2021

## 2021-03-04 NOTE — Patient Instructions (Signed)
Nice to see you again  Continue Trelegy  Let me know if you need refills  Your breathing tests were totally normal which is good!  Most likely you have asthma well treated with Trelegy  Return to clinic in 6 months or sooner as needed

## 2021-03-19 ENCOUNTER — Encounter: Payer: Self-pay | Admitting: Pulmonary Disease

## 2021-03-20 NOTE — Telephone Encounter (Signed)
MH please advise on the results of the PFT>  thanks

## 2021-03-26 ENCOUNTER — Other Ambulatory Visit (HOSPITAL_COMMUNITY): Payer: Self-pay | Admitting: Psychiatry

## 2021-03-26 DIAGNOSIS — F5105 Insomnia due to other mental disorder: Secondary | ICD-10-CM

## 2021-03-26 DIAGNOSIS — F411 Generalized anxiety disorder: Secondary | ICD-10-CM

## 2021-03-26 DIAGNOSIS — F99 Mental disorder, not otherwise specified: Secondary | ICD-10-CM

## 2021-05-20 ENCOUNTER — Other Ambulatory Visit: Payer: Self-pay | Admitting: Family Medicine

## 2021-05-20 DIAGNOSIS — J449 Chronic obstructive pulmonary disease, unspecified: Secondary | ICD-10-CM

## 2021-06-28 ENCOUNTER — Ambulatory Visit: Payer: 59 | Attending: Family Medicine | Admitting: Family Medicine

## 2021-06-28 ENCOUNTER — Encounter: Payer: Self-pay | Admitting: Family Medicine

## 2021-06-28 ENCOUNTER — Other Ambulatory Visit: Payer: Self-pay

## 2021-06-28 VITALS — BP 168/99 | HR 71 | Ht 63.0 in | Wt 150.0 lb

## 2021-06-28 DIAGNOSIS — U071 COVID-19: Secondary | ICD-10-CM

## 2021-06-28 MED ORDER — PREDNISONE 20 MG PO TABS
20.0000 mg | ORAL_TABLET | Freq: Every day | ORAL | 0 refills | Status: DC
Start: 2021-06-28 — End: 2021-11-07

## 2021-06-28 MED ORDER — BENZONATATE 100 MG PO CAPS: 100.0000 mg | ORAL_CAPSULE | Freq: Two times a day (BID) | ORAL | 0 refills | Status: DC | PRN

## 2021-06-28 NOTE — Progress Notes (Signed)
Still has cough and chest congestion.

## 2021-06-28 NOTE — Progress Notes (Signed)
Subjective:  Patient ID: Julie Hensley, female    DOB: 1960/07/31  Age: 61 y.o. MRN: [id number]  CC: Cough   HPI Deeana Atwater is a 61 y.o. year old female patient of the family medicine clinic with a history of asthma/COPD, tobacco abuse, hypertension who presents for an acute visit today.  Interval History: She tested positive for COVID 6 days ago through a home COVID test and cannot get rid of the congestion in her chest and feels like she wants to pound on her chest for relief. She coughs so much and feels like she is choking.  Her ears also feels clogged up when she feels dizzy.  Denies presence of fever, myalgias, shortness of breath. She received 2 doses of COVID-vaccine in 2021 but has not received a booster; she was not treated with of the antiviral medications. Her asthma/COPD is managed by [facility] with her last office visit in 03/2021 and is compliant with her inhalers. Requesting note for work. Past Medical History:  Diagnosis Date   Acid reflux    Anxiety    Arthritis    Asthma 31/08/4006   Complication of anesthesia    COPD (chronic obstructive pulmonary disease) ([place])    Hypertension    HBP at [facility] but never been to doctor for it.    PONV (postoperative nausea and vomiting)     Past Surgical History:  Procedure Laterality Date   APPENDECTOMY     MANDIBLE FRACTURE SURGERY     TONSILLECTOMY      Family History  Problem Relation Age of Onset   Hypertension Mother    Heart disease Mother    COPD Mother    Hypertension Father    Stroke Father    Hypertension Brother    Diabetes Paternal Grandmother    Anesthesia problems Neg Hx     No Known Allergies  Outpatient Medications Prior to Visit  Medication Sig Dispense Refill   acetaminophen (TYLENOL) 500 MG tablet Take 1,000-1,500 mg by mouth daily as needed for headache.     albuterol (VENTOLIN HFA) 108 (90 Base) MCG/ACT inhaler INHALE 2 PUFFS INTO LUNGS EVERY 4 HOURS AS NEEDED 18 each 2    Fluticasone-Umeclidin-Vilant (TRELEGY ELLIPTA) 200-62.5-25 MCG/INH AEPB Inhale 1 puff into the lungs daily. 60 each 11   hydrochlorothiazide (HYDRODIURIL) 25 MG tablet Take 25 mg by mouth daily.     traZODone (DESYREL) 50 MG tablet TAKE 1 TABLET BY MOUTH EVERY DAY AT BEDTIME AS NEEDED FOR SLEEP 90 tablet 1   No facility-administered medications prior to visit.     ROS Review of Systems  Constitutional:  Negative for activity change, appetite change and fatigue.  HENT:  Negative for congestion, sinus pressure and sore throat.   Eyes:  Negative for visual disturbance.  Respiratory:  Positive for cough. Negative for chest tightness, shortness of breath and wheezing.   Cardiovascular:  Negative for chest pain and palpitations.  Gastrointestinal:  Negative for abdominal distention, abdominal pain and constipation.  Endocrine: Negative for polydipsia.  Genitourinary:  Negative for dysuria and frequency.  Musculoskeletal:  Negative for arthralgias and back pain.  Skin:  Negative for rash.  Neurological:  Positive for dizziness. Negative for tremors, light-headedness and numbness.  Hematological:  Does not bruise/bleed easily.  Psychiatric/Behavioral:  Negative for agitation and behavioral problems.    Objective:  BP (!) 168/99    Pulse 71    Ht 5\' 3"  (1.6 m)    Wt 150 lb (68 kg)  SpO2 97%    BMI 26.57 kg/m   BP/Weight 06/28/2021 03/04/2021 8/50/2774  Systolic BP 128 786 767  Diastolic BP 99 72 72  Wt. (Lbs) 150 150 153.2  BMI 26.57 26.57 27.14      Physical Exam Constitutional:      Appearance: She is well-developed.  HENT:     Right Ear: Tympanic membrane normal.     Left Ear: Tympanic membrane normal.  Cardiovascular:     Rate and Rhythm: Normal rate.     Heart sounds: Normal heart sounds. No murmur heard. Pulmonary:     Effort: Pulmonary effort is normal.     Breath sounds: Normal breath sounds. No wheezing or rales.  Chest:     Chest wall: No tenderness.  Abdominal:      General: Bowel sounds are normal. There is no distension.     Palpations: Abdomen is soft. There is no mass.     Tenderness: There is no abdominal tenderness.  Musculoskeletal:        General: Normal range of motion.     Right lower leg: No edema.     Left lower leg: No edema.  Neurological:     Mental Status: She is alert and oriented to person, place, and time.  Psychiatric:        Mood and Affect: Mood normal.    CMP Latest Ref Rng & Units 07/02/2020 12/16/2014 12/16/2014  Glucose 65 - 99 mg/dL 96 132(H) 137(H)  BUN 8 - 27 mg/dL 6(L) 10 8  Creatinine 0.57 - 1.00 mg/dL 0.90 1.50(H) 1.53(H)  Sodium 134 - 144 mmol/L 141 139 138  Potassium 3.5 - 5.2 mmol/L 4.5 2.9(L) 3.0(L)  Chloride 96 - 106 mmol/L 104 101 100(L)  CO2 20 - 29 mmol/L 19(L) - 22  Calcium 8.7 - 10.3 mg/dL 9.8 - 10.8(H)  Total Protein 6.0 - 8.5 g/dL 6.6 - -  Total Bilirubin 0.0 - 1.2 mg/dL 0.3 - -  Alkaline Phos 44 - 121 IU/L 124(H) - -  AST 0 - 40 IU/L 18 - -  ALT 0 - 32 IU/L 14 - -    Lipid Panel     Component Value Date/Time   CHOL 201 (H) 06/10/2014 1015   TRIG 200 (H) 06/10/2014 1015   HDL 44 06/10/2014 1015   CHOLHDL 4.6 06/10/2014 1015   VLDL 40 06/10/2014 1015   LDLCALC 117 (H) 06/10/2014 1015    CBC    Component Value Date/Time   WBC 9.5 07/02/2020 1359   WBC 10.0 12/16/2014 1850   RBC 4.42 07/02/2020 1359   RBC 5.23 (H) 12/16/2014 1850   HGB 12.6 07/02/2020 1359   HCT 38.2 07/02/2020 1359   PLT 328 07/02/2020 1359   MCV 86 07/02/2020 1359   MCH 28.5 07/02/2020 1359   MCH 30.4 12/16/2014 1850   MCHC 33.0 07/02/2020 1359   MCHC 34.9 12/16/2014 1850   RDW 13.6 07/02/2020 1359   LYMPHSABS 1.2 07/02/2020 1359   MONOABS 0.4 06/10/2014 1015   EOSABS 0.2 07/02/2020 1359   BASOSABS 0.0 07/02/2020 1359    Lab Results  Component Value Date   HGBA1C 5.2 06/10/2014    Assessment & Plan:  1. COVID-19 virus infection Ongoing cough which is productive She is past duration of isolation but  continues to be symptomatic Provided note for work - predniSONE (DELTASONE) 20 MG tablet; Take 1 tablet (20 mg total) by mouth daily with breakfast.  Dispense: 5 tablet; Refill: 0 - benzonatate (TESSALON)  100 MG capsule; Take 1 capsule (100 mg total) by mouth 2 (two) times daily as needed for cough.  Dispense: 20 capsule; Refill: 0    Meds ordered this encounter  Medications   predniSONE (DELTASONE) 20 MG tablet    Sig: Take 1 tablet (20 mg total) by mouth daily with breakfast.    Dispense:  5 tablet    Refill:  0   benzonatate (TESSALON) 100 MG capsule    Sig: Take 1 capsule (100 mg total) by mouth 2 (two) times daily as needed for cough.    Dispense:  20 capsule    Refill:  0    Follow-up: Return for Medical conditions with PCP.       Charlott Rakes, MD, FAAFP. [facility] [facility], [place]   06/28/2021, 3:32 PM

## 2021-06-28 NOTE — Patient Instructions (Signed)

## 2021-10-05 ENCOUNTER — Encounter: Payer: Self-pay | Admitting: *Deleted

## 2021-11-05 ENCOUNTER — Other Ambulatory Visit: Payer: Self-pay | Admitting: Family Medicine

## 2021-11-05 ENCOUNTER — Ambulatory Visit (INDEPENDENT_AMBULATORY_CARE_PROVIDER_SITE_OTHER): Payer: 59 | Admitting: Family Medicine

## 2021-11-05 ENCOUNTER — Encounter: Payer: Self-pay | Admitting: Family Medicine

## 2021-11-05 VITALS — BP 176/100 | HR 93 | Ht 63.0 in | Wt 139.8 lb

## 2021-11-05 DIAGNOSIS — Z720 Tobacco use: Secondary | ICD-10-CM

## 2021-11-05 DIAGNOSIS — Z1211 Encounter for screening for malignant neoplasm of colon: Secondary | ICD-10-CM | POA: Diagnosis not present

## 2021-11-05 DIAGNOSIS — Z1231 Encounter for screening mammogram for malignant neoplasm of breast: Secondary | ICD-10-CM

## 2021-11-05 DIAGNOSIS — R634 Abnormal weight loss: Secondary | ICD-10-CM

## 2021-11-05 DIAGNOSIS — J449 Chronic obstructive pulmonary disease, unspecified: Secondary | ICD-10-CM

## 2021-11-05 DIAGNOSIS — I10 Essential (primary) hypertension: Secondary | ICD-10-CM | POA: Diagnosis not present

## 2021-11-05 DIAGNOSIS — Z1159 Encounter for screening for other viral diseases: Secondary | ICD-10-CM

## 2021-11-05 LAB — POCT URINALYSIS DIP (MANUAL ENTRY)
Bilirubin, UA: NEGATIVE
Glucose, UA: NEGATIVE mg/dL
Ketones, POC UA: NEGATIVE mg/dL
Leukocytes, UA: NEGATIVE
Nitrite, UA: NEGATIVE
Protein Ur, POC: NEGATIVE mg/dL
Spec Grav, UA: 1.01 (ref 1.010–1.025)
Urobilinogen, UA: 0.2 E.U./dL
pH, UA: 7 (ref 5.0–8.0)

## 2021-11-05 MED ORDER — LOSARTAN POTASSIUM 25 MG PO TABS: 25.0000 mg | ORAL_TABLET | Freq: Every day | ORAL | 0 refills | Status: DC

## 2021-11-05 MED ORDER — BLOOD PRESSURE CUFF MISC: 0 refills | Status: DC

## 2021-11-05 MED ORDER — LOSARTAN POTASSIUM 50 MG PO TABS: 50.0000 mg | ORAL_TABLET | Freq: Every day | ORAL | 0 refills | Status: DC

## 2021-11-05 NOTE — Progress Notes (Signed)
    SUBJECTIVE:   CHIEF COMPLAINT / HPI:   Unintentional Weight Loss -Patient reports she has lost weight over past 1 month -She is unsure of exactly how much, thinks maybe 20 lbs -She does endorse feeling nauseous but no vomiting -Denies any significant change in eating habits -No abdominal pain, no blood in stool -No night sweats, cough, or shortness of breath -Denies depressed mood -No concerns related to food insecurity  Elevated Blood Pressure -previous hx of HTN -was told she doesn't needs meds anymore -hasn't taken BP meds in over a year (previously on HCTZ $Remov'25mg'tDznvJ$ ) -doesn't check BP at home -does get headaches some days  Patient comes with list of multiple concerns including: -headaches -tingling in hands/feet -bones aching in legs -bee stings on leg -fatigue Advised she will need separate appointment to discuss these concerns.  PERTINENT  PMH / PSH: tobacco use, anxiety  OBJECTIVE:   BP (!) 176/100   Pulse 93   Ht $R'5\' 3"'qD$  (1.6 m)   Wt 139 lb 12.8 oz (63.4 kg)   SpO2 97%   BMI 24.76 kg/m   Gen: NAD, able to participate in exam Lymph: no cervical or supraclavicular lymphadenopathy CV: RRR, normal S1/S2, no murmur Resp: Normal effort, lungs CTAB GI: abdomen soft, non-tender, non-distended Extremities: no edema or cyanosis Skin: warm and dry, no rashes noted Neuro: alert, no obvious focal deficits Psych: Normal affect and mood   ASSESSMENT/PLAN:   Essential hypertension BP extremely elevated today. Known history of HTN but has been off meds for at least 1 year. -Check CMP -Start Losartan $RemoveBeforeD'50mg'omMRCUpWsPxUYy$  daily -Encouraged patient to check BP at home and bring written log to f/u appt  Unintentional weight loss 11lb weight loss noted over past 4 months based on chart review. Patient thinks majority has occurred over past 1 month. No night sweats, fevers, cough, dyspnea, abdominal pain or blood in stool. Unfortunately she is not up to date on any age-appropriate cancer  screening. - mammogram ordered - referral to GI for colonoscopy - low dose lung CT - schedule separate appt for Pap - check blood work today including CMP, CBC w/diff, TSH, CRP/ESR, HIV, Hep C as well as UA    Alcus Dad, MD [place]

## 2021-11-05 NOTE — Patient Instructions (Addendum)
It was great to meet you!  Things we discussed today: Your blood pressure is extremely high. We need to restart you on blood pressure medicine. I sent Losartan to your pharmacy. Take it once daily. Check your blood pressure once daily at home. Keep a written log of the numbers and bring it to your next appointment. We are checking blood work today. I will send you a MyChart message with the results For your weight loss- it is very important to have your recommended cancer screening tests. I have ordered a mammogram, colonoscopy, and lung cancer screening. Please also schedule a separate appointment for a pap smear  Please call [facility] at [phone] to schedule an appointment for your mammogram. They are located at [place] will call you to schedule the colonoscopy and lung cancer CT.  -Dr Rock Nephew

## 2021-11-06 ENCOUNTER — Other Ambulatory Visit (HOSPITAL_COMMUNITY): Payer: Self-pay | Admitting: Psychiatry

## 2021-11-06 DIAGNOSIS — F411 Generalized anxiety disorder: Secondary | ICD-10-CM

## 2021-11-06 DIAGNOSIS — F99 Mental disorder, not otherwise specified: Secondary | ICD-10-CM

## 2021-11-06 LAB — COMPREHENSIVE METABOLIC PANEL
ALT: 9 IU/L (ref 0–32)
AST: 11 IU/L (ref 0–40)
Albumin/Globulin Ratio: 1.8 (ref 1.2–2.2)
Albumin: 4.4 g/dL (ref 3.8–4.8)
Alkaline Phosphatase: 107 IU/L (ref 44–121)
BUN/Creatinine Ratio: 6 — ABNORMAL LOW (ref 12–28)
BUN: 5 mg/dL — ABNORMAL LOW (ref 8–27)
Bilirubin Total: 0.3 mg/dL (ref 0.0–1.2)
CO2: 26 mmol/L (ref 20–29)
Calcium: 10.4 mg/dL — ABNORMAL HIGH (ref 8.7–10.3)
Chloride: 100 mmol/L (ref 96–106)
Creatinine, Ser: 0.79 mg/dL (ref 0.57–1.00)
Globulin, Total: 2.5 g/dL (ref 1.5–4.5)
Glucose: 90 mg/dL (ref 70–99)
Potassium: 3.4 mmol/L — ABNORMAL LOW (ref 3.5–5.2)
Sodium: 141 mmol/L (ref 134–144)
Total Protein: 6.9 g/dL (ref 6.0–8.5)
eGFR: 85 mL/min/{1.73_m2} (ref >59)

## 2021-11-06 LAB — CBC WITH DIFFERENTIAL/PLATELET
Basophils Absolute: 0.1 10*3/uL (ref 0.0–0.2)
Basos: 1 %
EOS (ABSOLUTE): 0.2 10*3/uL (ref 0.0–0.4)
Eos: 2 %
Hematocrit: 44.2 % (ref 34.0–46.6)
Hemoglobin: 15 g/dL (ref 11.1–15.9)
Immature Grans (Abs): 0 10*3/uL (ref 0.0–0.1)
Immature Granulocytes: 1 %
Lymphocytes Absolute: 1.5 10*3/uL (ref 0.7–3.1)
Lymphs: 22 %
MCH: 30.5 pg (ref 26.6–33.0)
MCHC: 33.9 g/dL (ref 31.5–35.7)
MCV: 90 fL (ref 79–97)
Monocytes Absolute: 0.4 10*3/uL (ref 0.1–0.9)
Monocytes: 6 %
Neutrophils Absolute: 4.8 10*3/uL (ref 1.4–7.0)
Neutrophils: 68 %
Platelets: 260 10*3/uL (ref 150–450)
RBC: 4.92 x10E6/uL (ref 3.77–5.28)
RDW: 13 % (ref 11.7–15.4)
WBC: 6.9 10*3/uL (ref 3.4–10.8)

## 2021-11-06 LAB — C-REACTIVE PROTEIN: CRP: 17 mg/L — ABNORMAL HIGH (ref 0–10)

## 2021-11-06 LAB — HCV AB W REFLEX TO QUANT PCR: HCV Ab: NONREACTIVE

## 2021-11-06 LAB — TSH: TSH: 1.34 u[IU]/mL (ref 0.450–4.500)

## 2021-11-06 LAB — SEDIMENTATION RATE: Sed Rate: 35 mm/hr (ref 0–40)

## 2021-11-06 LAB — HCV INTERPRETATION

## 2021-11-06 LAB — HIV ANTIBODY (ROUTINE TESTING W REFLEX): HIV Screen 4th Generation wRfx: NONREACTIVE

## 2021-11-07 NOTE — Assessment & Plan Note (Signed)
BP extremely elevated today. Known history of HTN but has been off meds for at least 1 year. -Check CMP -Start Losartan '50mg'$  daily -Encouraged patient to check BP at home and bring written log to f/u appt

## 2021-11-07 NOTE — Assessment & Plan Note (Signed)
11lb weight loss noted over past 4 months based on chart review. Patient thinks majority has occurred over past 1 month. No night sweats, fevers, cough, dyspnea, abdominal pain or blood in stool. Unfortunately she is not up to date on any age-appropriate cancer screening. - mammogram ordered - referral to GI for colonoscopy - low dose lung CT - schedule separate appt for Pap - check blood work today including CMP, CBC w/diff, TSH, CRP/ESR, HIV, Hep C as well as UA

## 2021-11-08 ENCOUNTER — Telehealth: Payer: Self-pay

## 2021-11-08 NOTE — Telephone Encounter (Signed)
Patient calls nurse line requesting returned phone call to discuss results.   Please let me know if you have a detailed message you would like relayed to patient.   Talbot Grumbling, RN

## 2021-11-09 MED ORDER — OMEPRAZOLE 20 MG PO CPDR: 20.0000 mg | DELAYED_RELEASE_CAPSULE | Freq: Every day | ORAL | 3 refills | Status: DC

## 2021-11-09 NOTE — Addendum Note (Signed)
Addended by: Alcus Dad on: 11/09/2021 01:04 PM   Modules accepted: Orders

## 2021-11-09 NOTE — Telephone Encounter (Signed)
Returned patient's call. Informed of unremarkable lab results.  CRP mildly elevated which is nonspecific. UA positive for blood so will plan for repeat UA with micro at next visit.  Otherwise normal results. Patient appreciative of call.  Alcus Dad, MD PGY-3 [place]

## 2021-11-09 NOTE — Telephone Encounter (Signed)
Patient returns call to nurse line requesting refill on omeprazole 40 mg. Patient states that she has been prescribed this from our office in the past, however, this is not on current medication list.   Will forward request to PCP.   Please advise.   Talbot Grumbling, RN

## 2021-11-09 NOTE — Telephone Encounter (Addendum)
Rx sent for omeprazole '20mg'$  daily.

## 2021-11-12 ENCOUNTER — Telehealth: Payer: Self-pay

## 2021-11-12 NOTE — Telephone Encounter (Signed)
Patient calls nurse line requesting medication to "calm my nerves." Patient reports that she has been feeling anxious for several days now. She is worried about her mother and sick dog. She states that she is the only one who can help care for her mother, however, she is unable to adequately care for her due to her nerves.  Patient reports that Xanax has worked well in the past. Advised patient that she would need an appointment prior to anxiety medication being sent in. Patient tearful during conversation.   We do not have any appointments until next Thursday. Attempted to give patient resources for the [facility] center. Patient became very upset and stated, "I'll just deal with it myself, just forget it." Patient then hung up the phone.   Will forward to PCP.   Talbot Grumbling, RN

## 2021-11-15 NOTE — Telephone Encounter (Signed)
Agree with below. Patient needs appointment to discuss.   Alcus Dad, MD PGY-3, [place]

## 2021-11-19 ENCOUNTER — Ambulatory Visit
Admission: RE | Admit: 2021-11-19 | Discharge: 2021-11-19 | Disposition: A | Discharge status: Home / Self Care | Payer: 59 | Source: Ambulatory Visit | Attending: Family Medicine | Admitting: Family Medicine

## 2021-11-19 DIAGNOSIS — R634 Abnormal weight loss: Secondary | ICD-10-CM

## 2021-11-19 DIAGNOSIS — Z1231 Encounter for screening mammogram for malignant neoplasm of breast: Secondary | ICD-10-CM

## 2021-11-27 ENCOUNTER — Other Ambulatory Visit: Payer: Self-pay | Admitting: Family Medicine

## 2021-11-27 DIAGNOSIS — I10 Essential (primary) hypertension: Secondary | ICD-10-CM

## 2021-12-01 ENCOUNTER — Other Ambulatory Visit: Payer: Self-pay | Admitting: Family Medicine

## 2021-12-03 ENCOUNTER — Ambulatory Visit (INDEPENDENT_AMBULATORY_CARE_PROVIDER_SITE_OTHER): Payer: 59 | Admitting: Family Medicine

## 2021-12-03 ENCOUNTER — Encounter: Payer: Self-pay | Admitting: Family Medicine

## 2021-12-03 VITALS — BP 142/86 | HR 81 | Ht 63.0 in | Wt 137.6 lb

## 2021-12-03 DIAGNOSIS — R69 Illness, unspecified: Secondary | ICD-10-CM | POA: Diagnosis not present

## 2021-12-03 DIAGNOSIS — R3129 Other microscopic hematuria: Secondary | ICD-10-CM | POA: Diagnosis not present

## 2021-12-03 DIAGNOSIS — R109 Unspecified abdominal pain: Secondary | ICD-10-CM

## 2021-12-03 DIAGNOSIS — F5105 Insomnia due to other mental disorder: Secondary | ICD-10-CM | POA: Diagnosis not present

## 2021-12-03 DIAGNOSIS — F411 Generalized anxiety disorder: Secondary | ICD-10-CM

## 2021-12-03 DIAGNOSIS — I10 Essential (primary) hypertension: Secondary | ICD-10-CM

## 2021-12-03 DIAGNOSIS — F99 Mental disorder, not otherwise specified: Secondary | ICD-10-CM

## 2021-12-03 LAB — POCT UA - MICROSCOPIC ONLY

## 2021-12-03 NOTE — Patient Instructions (Addendum)
It was great to see you!  For your abdominal pain: Start taking Miralax (1 capful daily) and Senna (daily). I will send prescriptions to your pharmacy. Increase your omeprazole from '20mg'$  to '40mg'$ . I will send a new prescription. Call the GI office to schedule an appointment and colonoscopy. [facility] [phone] We are re-checking your urine and kidney function today. I will let you know about the results Come back to discuss other concerns  For your anxiety, you can consider going here in the meantime:   [facility] [address]  [place], [place] [address] [phone]  Urgent psychiatry (medication management) Monday-Thursday 8-11AM.   It is highly recommended that you show up at 7/730 because it is first come first serve.   For urgent therapy (not medication) Walk in hours are 8-1pm Monday through Wednesday (please come at 7/730 to ensure you are seen)

## 2021-12-03 NOTE — Progress Notes (Signed)
    SUBJECTIVE:   CHIEF COMPLAINT / HPI:   "My stomach" -abdominal pain -located mostly in lower right side -ongoing for 1.5 months -reports the pain is constant, every day -states her stomach has always been a problem -often worse after meals -endorses significant constipation: BM once a month -strains with BMs, sometimes needs an enema  -tried Miralax once in the past, did not help, seemed to cause more abdominal pain -has very poor appetite and some weight loss as well -no blood in stool -no vaginal bleeding -no vomiting or fever  Patient brings list of multiple concerns: -Headaches -Bee stings on legs -Restless Legs -Muscle Aches -Feet hurt -Heartburn -Anxiety  PERTINENT  PMH / PSH: anxiety, GERD, tobacco use  OBJECTIVE:   BP (!) 142/86   Pulse 81   Ht '5\' 3"'$  (1.6 m)   Wt 137 lb 9.6 oz (62.4 kg)   SpO2 100%   BMI 24.37 kg/m   Gen: NAD, able to participate in exam HEENT: [place]/AT, oropharynx unremarkable Neck: supple CV: RRR, normal S1/S2, no murmur Resp: Normal effort, lungs CTAB GI: Bowel sounds present, abdomen soft, non-tender, non-distended Extremities: no edema or cyanosis Skin: warm and dry, no rashes noted Neuro: alert, no obvious focal deficits Psych: moderately anxious   ASSESSMENT/PLAN:   Abdominal pain Subacute to chronic. Likely multifactorial. Certainly her constipation is playing some role. Start Miralax + Senna daily. Will also address GERD by increasing omeprazole to '40mg'$  daily. Patient needs colonoscopy (never had colon cancer screening, has had recent weight loss). GI referral previously placed- patient given [place] phone # in AVS. I suspect her anxiety is also contributing and will need to be addressed at future visit.  Essential hypertension BP improved after initiation of Losartan '50mg'$  daily, but still above goal. Repeat BMP today. If persistently elevated at next appointment, recommend uptitration of medication.  Generalized anxiety  disorder Known h/o GAD but is not currently on medication other than trazodone at bedtime to help with sleep. Refills sent today per her request. Saw psych in the past. Did not tolerate Prozac, Zoloft, Mirtazapine, amitriptyline, or seroquel. Had been requesting Xanax but this was not prescribed. Needs separate appointment to discuss further. She was also given [facility] psychiatry information in AVS today.   Hematuria Blood noted on UA at last visit but no micro was performed. Urine micro obtained today with 0-2 RBCs per HPF. No additional follow up needed.   Alcus Dad, MD [place]

## 2021-12-04 LAB — BASIC METABOLIC PANEL
BUN/Creatinine Ratio: 9 — ABNORMAL LOW (ref 12–28)
BUN: 7 mg/dL — ABNORMAL LOW (ref 8–27)
CO2: 24 mmol/L (ref 20–29)
Calcium: 10.5 mg/dL — ABNORMAL HIGH (ref 8.7–10.3)
Chloride: 105 mmol/L (ref 96–106)
Creatinine, Ser: 0.78 mg/dL (ref 0.57–1.00)
Glucose: 81 mg/dL (ref 70–99)
Potassium: 3.8 mmol/L (ref 3.5–5.2)
Sodium: 145 mmol/L — ABNORMAL HIGH (ref 134–144)
eGFR: 86 mL/min/{1.73_m2} (ref >59)

## 2021-12-04 MED ORDER — OMEPRAZOLE 40 MG PO CPDR: 40.0000 mg | DELAYED_RELEASE_CAPSULE | Freq: Every day | ORAL | 0 refills | Status: DC

## 2021-12-04 MED ORDER — POLYETHYLENE GLYCOL 3350 17 GM/SCOOP PO POWD: 17.0000 g | Freq: Every day | ORAL | 0 refills | Status: DC

## 2021-12-04 MED ORDER — TRAZODONE HCL 100 MG PO TABS: 100.0000 mg | ORAL_TABLET | Freq: Every evening | ORAL | 0 refills | Status: DC | PRN

## 2021-12-04 MED ORDER — SENNA 8.6 MG PO TABS
1.0000 | ORAL_TABLET | Freq: Every day | ORAL | 0 refills | Status: AC
Start: 2021-12-04 — End: ?

## 2021-12-05 DIAGNOSIS — R109 Unspecified abdominal pain: Secondary | ICD-10-CM | POA: Insufficient documentation

## 2021-12-05 NOTE — Assessment & Plan Note (Addendum)
Known h/o GAD but is not currently on medication other than trazodone at bedtime to help with sleep. Refills sent today per her request. Saw psych in the past. Did not tolerate Prozac, Zoloft, Mirtazapine, amitriptyline, or seroquel. Had been requesting Xanax but this was not prescribed. Needs separate appointment to discuss further. She was also given [facility] psychiatry information in AVS today.

## 2021-12-05 NOTE — Assessment & Plan Note (Signed)
BP improved after initiation of Losartan '50mg'$  daily, but still above goal. Repeat BMP today. If persistently elevated at next appointment, recommend uptitration of medication.

## 2021-12-05 NOTE — Assessment & Plan Note (Signed)
Subacute to chronic. Likely multifactorial. Certainly her constipation is playing some role. Start Miralax + Senna daily. Will also address GERD by increasing omeprazole to '40mg'$  daily. Patient needs colonoscopy (never had colon cancer screening, has had recent weight loss). GI referral previously placed- patient given [facility] phone # in AVS. I suspect her anxiety is also contributing and will need to be addressed at future visit.

## 2021-12-06 ENCOUNTER — Other Ambulatory Visit: Payer: Self-pay | Admitting: Family Medicine

## 2021-12-06 ENCOUNTER — Encounter: Payer: Self-pay | Admitting: Gastroenterology

## 2021-12-06 DIAGNOSIS — I10 Essential (primary) hypertension: Secondary | ICD-10-CM

## 2021-12-09 ENCOUNTER — Telehealth: Payer: Self-pay

## 2021-12-09 NOTE — Telephone Encounter (Signed)
Patient calls nurse line regarding issues with omeprazole capsules. Received fax from pharmacy regarding quantity limit limitation. Insurance will only cover 90 tablets per 365 days.   [facility]. Due to plan limitation, it is requested that pharmacy dispense 30 day supply.   Called pharmacy. Rx went through insurance for 30 day supply.  Called patient to provide update. Patient did not answer, LVM asking patient to return call to office.   Talbot Grumbling, RN

## 2021-12-13 ENCOUNTER — Ambulatory Visit: Payer: 59 | Admitting: Gastroenterology

## 2021-12-13 ENCOUNTER — Encounter: Payer: Self-pay | Admitting: Gastroenterology

## 2021-12-13 ENCOUNTER — Telehealth: Payer: Self-pay

## 2021-12-13 VITALS — BP 190/100 | HR 96 | Ht 61.75 in | Wt 135.0 lb

## 2021-12-13 DIAGNOSIS — K5900 Constipation, unspecified: Secondary | ICD-10-CM | POA: Diagnosis not present

## 2021-12-13 DIAGNOSIS — R634 Abnormal weight loss: Secondary | ICD-10-CM

## 2021-12-13 DIAGNOSIS — R109 Unspecified abdominal pain: Secondary | ICD-10-CM

## 2021-12-13 DIAGNOSIS — I10 Essential (primary) hypertension: Secondary | ICD-10-CM

## 2021-12-13 DIAGNOSIS — I2510 Atherosclerotic heart disease of native coronary artery without angina pectoris: Secondary | ICD-10-CM

## 2021-12-13 DIAGNOSIS — K219 Gastro-esophageal reflux disease without esophagitis: Secondary | ICD-10-CM

## 2021-12-13 MED ORDER — DICYCLOMINE HCL 10 MG PO CAPS: 10.0000 mg | ORAL_CAPSULE | Freq: Three times a day (TID) | ORAL | 1 refills | Status: DC | PRN

## 2021-12-13 NOTE — Progress Notes (Signed)
HPI :  61 year old female with a history of hypertension, GERD, tobacco use, COPD, referred here by Dr. Alcus Dad for abdominal pain, weight loss, bowel changes.  The patient has multiple complaints that are bothering her today.  Her main complaint has been abdominal pain ongoing for the past 2 months or so.  She states this is actually been going on much longer than that but has been intermittent up until the past 2 months, or is more so constant now.  She rates it anywhere from 5-10 out of 10, usually around her periumbilical to lower abdominal area.  She states eating seems to be a trigger that can make this worse although its not clear that every meal can cause this.  She has had a poor appetite, eating about 1 meal per day since symptoms have been bothering her.  She thinks she is dropped 20 pounds over the past few months.  She has baseline constipation, has about 2 bowel movements per week.  Uses enemas over-the-counter as needed to manage this.  She states previously having a bowel movement would make this better but that has not happened lately.  She has not tried much of a bowel regimen other than over-the-counter stool softeners etc.  She was recently recommended MiraLAX but states she thought it made her stomach a bit upset.  She denies any blood in her stools.  She has never had a colonoscopy.  No family history of colon cancer known or esophageal cancer/gastric cancer.  She denies any nausea or vomiting.  Does not have fear of eating, actually thinks her appetites been better lately.  She has had GERD for a long time, has been on omeprazole 20 mg daily for a long time.  She states generally that had worked to control her symptoms but more recently her reflux has been poorly controlled.  She has a lot of breakthrough depending on what types of food she is eating.  No dysphagia.  She was recently told to increase omeprazole to 40 mg daily but has not done so yet.  She has a history of  tobacco use, smokes anywhere from half pack a day or more.  Has a history of reported COPD.  She has had coronary artery calcifications noted on prior CT scans of her chest.  She states she has baseline dyspnea on exertion and has occasional chest pains when she exerts herself, she states this has been ongoing for some time.  She wants to see a cardiologist..  She is not on any supplemental oxygen.  Her blood pressure is about 190/100 today.  This has been elevated in the past few months.  Her last visit with her primary care earlier this month showed that her blood pressure was much better controlled, patient states she had been taking her blood pressure at home and keeping an eye on it in recent months but has not done so for the past several weeks.  She has chronic headaches, denies any changes with that today or acute visual changes or acute changes in any other symptoms that she has had.  She states this has been an ongoing issue for her.  She is currently taking losartan 50 mg daily.  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    CT angio chest / abdomen / pelvis 12/16/2014: IMPRESSION: 1. No evidence  of aortic dissection. No evidence of aneurysmal dilatation. Mild calcific atherosclerotic disease along the abdominal aorta, and minimally along the aortic arch and proximal subclavian arteries bilaterally. Slight mural thrombus along the proximal left subclavian artery, without significant luminal narrowing. 2. No evidence of central pulmonary embolus. 3. Mild bibasilar atelectasis noted.  Lungs otherwise clear. 4. Small bilateral renal cysts seen.  CT chest 12/18/2020: IMPRESSION: 1. Lung-RADS 2, benign appearance or behavior. Continue annual screening with low-dose chest CT without contrast in 12 months. 2. Aortic atherosclerosis  (ICD10-I70.0). Coronary artery calcification. 3.  Emphysema (ICD10-J43.9).  Past Medical History:  Diagnosis Date   Acid reflux    Anxiety    Arthritis    Asthma 03/04/2021   Chronic headaches    Complication of anesthesia    COPD (chronic obstructive pulmonary disease) (HCC)    GERD (gastroesophageal reflux disease)    Hypertension    HBP at [employer] machine but never been to doctor for it.    PONV (postoperative nausea and vomiting)      Past Surgical History:  Procedure Laterality Date   APPENDECTOMY     EXPLORATORY LAPAROTOMY     MANDIBLE FRACTURE SURGERY     TONSILLECTOMY     Family History  Problem Relation Age of Onset   Hypertension Mother    Heart disease Mother    COPD Mother    Thyroid disease Mother    Hernia Mother    Hypertension Father    Stroke Father    Hypertension Sister    Hyperlipidemia Sister    Hypertension Brother    Hernia Brother    Hypertension Brother    Hypertension Brother    Hypertension Brother    Hernia Brother    Heart disease Maternal Grandmother    Heart disease Maternal Grandfather    Diabetes Paternal Grandmother    Heart disease Paternal Grandmother    Heart disease Paternal Grandfather    Anesthesia problems Neg Hx    Social History   Tobacco Use   Smoking status: Every Day    Packs/day: 0.50    Years: 40.00    Total pack years: 20.00    Types: Cigarettes   Smokeless tobacco: Never   Tobacco comments:    0.5 ppd 12/09/2020  Vaping Use   Vaping Use: Never used  Substance Use Topics   Alcohol use: No    Alcohol/week: 0.0 standard drinks of alcohol    Comment: has not had alcohol in a month.  Previously a case of beer daily   Drug use: No   Current Outpatient Medications  Medication Sig Dispense Refill   acetaminophen (TYLENOL) 500 MG tablet Take 1,000-1,500 mg by mouth daily as needed for headache.     albuterol (VENTOLIN HFA) 108 (90 Base) MCG/ACT inhaler INHALE 2 PUFFS INTO LUNGS EVERY 4 HOURS AS NEEDED 18  each 2   dicyclomine (BENTYL) 10 MG capsule Take 1 capsule (10 mg total) by mouth every 8 (eight) hours as needed for spasms. 30 capsule 1   Docusate Sodium (EQ STOOL SOFTENER PO) Take 1 tablet by mouth daily.     Fluticasone-Umeclidin-Vilant (TRELEGY ELLIPTA) 200-62.5-25 MCG/INH AEPB Inhale 1 puff into the lungs daily. 60 each 11   losartan (COZAAR) 50 MG tablet TAKE 1 TABLET BY MOUTH EVERY DAY 30 tablet 0   omeprazole (PRILOSEC) 40 MG capsule Take 1 capsule (40 mg total) by mouth daily. 90 capsule 0   traZODone (DESYREL) 100 MG tablet Take 1 tablet (100 mg total)  by mouth at bedtime as needed for sleep. 90 tablet 0   polyethylene glycol powder (GLYCOLAX/MIRALAX) 17 GM/SCOOP powder Take 17 g by mouth daily. (Patient not taking: Reported on 12/13/2021) 850 g 0   senna (SENOKOT) 8.6 MG TABS tablet Take 1 tablet (8.6 mg total) by mouth daily. (Patient not taking: Reported on 12/13/2021) 90 tablet 0   No current facility-administered medications for this visit.   No Known Allergies   Review of Systems: All systems reviewed and negative except where noted in HPI.    MM 3D SCREEN BREAST BILATERAL  Result Date: 11/22/2021 CLINICAL DATA:  Screening. Baseline mammogram. EXAM: DIGITAL SCREENING BILATERAL MAMMOGRAM WITH TOMOSYNTHESIS AND CAD TECHNIQUE: Bilateral screening digital craniocaudal and mediolateral oblique mammograms were obtained. Bilateral screening digital breast tomosynthesis was performed. The images were evaluated with computer-aided detection. COMPARISON:  None available. ACR Breast Density Category b: There are scattered areas of fibroglandular density. FINDINGS: There are no findings suspicious for malignancy. IMPRESSION: No mammographic evidence of malignancy. A result letter of this screening mammogram will be mailed directly to the patient. RECOMMENDATION: Screening mammogram in one year. (Code:[id number]) BI-RADS CATEGORY  1: Negative. Electronically Signed   By: Franki Cabot M.D.    On: 11/22/2021 15:09    Lab Results  Component Value Date   WBC 6.9 11/05/2021   HGB 15.0 11/05/2021   HCT 44.2 11/05/2021   MCV 90 11/05/2021   PLT 260 11/05/2021    Lab Results  Component Value Date   CREATININE 0.78 12/03/2021   BUN 7 (L) 12/03/2021   NA 145 (H) 12/03/2021   K 3.8 12/03/2021   CL 105 12/03/2021   CO2 24 12/03/2021    Lab Results  Component Value Date   ALT 9 11/05/2021   AST 11 11/05/2021   ALKPHOS 107 11/05/2021   BILITOT 0.3 11/05/2021     Physical Exam: BP (!) 190/100   Pulse 96   Ht 5' 1.75" (1.568 m) Comment: height measured without shoes  Wt 135 lb (61.2 kg)   BMI 24.89 kg/m  Constitutional: Pleasant,well-developed, female in no acute distress. HEENT: Normocephalic and atraumatic. Conjunctivae are normal. No scleral icterus. Neck supple.  Cardiovascular: Normal rate, regular rhythm.  Pulmonary/chest: [place] normal and breath sounds normal.  Abdominal: Soft, nondistended, mild periumbilical tenderness. There are no masses palpable.  Thank you extremities: no edema Lymphadenopathy: No cervical adenopathy noted. Neurological: Alert and oriented to person place and time. Skin: Skin is warm and dry. No rashes noted. Psychiatric: Normal mood and affect. Behavior is normal.   ASSESSMENT: 61 y.o. female here for assessment of the following  1. Abdominal pain, unspecified abdominal location   2. Loss of weight   3. Constipation, unspecified constipation type   4. Gastroesophageal reflux disease, unspecified whether esophagitis present   5. Essential hypertension   6. Coronary artery calcification seen on CAT scan     Patient with abdominal pain and weight loss bothering her significantly over the past 2 months, worse than usual.  She has baseline GERD which is not well controlled, also with history of constipation and in need of colonoscopy screening.  Discussed differential diagnosis with her for her abdominal pain.  Given her weight loss  and persistent abdominal pain in recent months recommend we start with a CT scan abdomen pelvis with contrast to further evaluate, ensure no underlying malignancy.  She is agreeable to do this.  In the interim we will need to treat her constipation more aggressively, recommend MiraLAX twice  daily and titrate to goal bowel movement once every day to every other day.  She can use senna as well.  I will also give her some Bentyl to use as needed to see if this will help some of her pain in the interim.  Her reflux is poorly controlled, agree with escalating her omeprazole to 40 mg daily, up from 20 mg daily.  She is in need of a screening colonoscopy, I would also offer her an EGD to further evaluate her symptoms if her CT scan is unrevealing, and to screen her for Barrett's esophagus.  She is interested in both EGD and colonoscopy however, holding off on scheduling for now due to uncontrolled hypertension and that I think she needs to see a cardiologist for further evaluation prior to giving her anesthesia.  Her blood pressure is elevated in the office today, she has had issues with this in recent months.  She is asymptomatic from this currently based on questioning today, recommend she talk with her primary care physician today, she may need to escalate her dosing of her blood pressure medication or add another medication in general and advised her to keep an eye on her blood pressure at home.  She is agreeable to do this and I will reach out to their office as well.  Further, she has some baseline dyspnea on exertion which could be explained by her COPD, although having some exertional chest discomfort over time from what she endorses as well, and in light of coronary artery calcifications noted on prior CT chest, she wants to see a cardiologist which I think is reasonable.  I will refer her to cardiology.  She will need clearance from them prior to scheduling EGD and colonoscopy.   PLAN: - call PCP today  regarding her blood pressure and to any adjustment to her HTN regimen that is needed, resume taking BP at home.  Based on questioning today she denies any symptoms related to her blood pressure however if she experiences these she will need to go to the ED - will refer her to cardiology for coronary calcifications and her exertional symptoms - schedule CT abdomen / pelvis with contrast - start Miralax BID + Senna - start bentyl '10mg'$  every 8 hours PRN  - trial omeprazole '40mg'$  / day - needs a colonoscopy +/- EGD but needs BP controlled better and cardiac evaluation first.  We will await her CT scan first and then discuss scheduling once her BP is better controlled and she has seen cardiology.  Jolly Mango, MD [facility]  CC Alcus Dad, MD

## 2021-12-13 NOTE — Telephone Encounter (Signed)
Patient calls nurse line regarding elevated BP. Patient was seen by GI specialist today and BP was 190/100. She reports compliance with losartan 50 mg, last dose this AM.   She states that she has been having headaches for several days. I recommended that patient be seen for further evaluation. Patient states that she has issues with transportation and has to work. Patient is asking if this can be managed over the phone.   Will forward to Dr. Rock Nephew for next steps.   ED precautions discussed.   Talbot Grumbling, RN

## 2021-12-13 NOTE — Patient Instructions (Signed)
Please contact your primary care provider about possible medication changes or other options for treatment of your high blood pressure. _______________________________________________________  Increase your omeprazole to 40 mg daily. _______________________________________________________  Julie Hensley have been scheduled for a CT scan of the abdomen and pelvis at [facility] Radiology (1st floor of hospital).   You are scheduled on 12/20/21 at 3:00 pm. You should arrive 15 minutes prior to your appointment time for registration. Please follow the written instructions below on the day of your exam:  WARNING: IF YOU ARE ALLERGIC TO IODINE/X-RAY DYE, PLEASE NOTIFY RADIOLOGY IMMEDIATELY AT [phone]! YOU WILL BE GIVEN A 13 HOUR PREMEDICATION PREP.  1) Do not eat or drink anything after 11:00 am (4 hours prior to your test) 2) You have been given 2 bottles of oral contrast to drink. The solution may taste better if refrigerated, but do NOT add ice or any other liquid to this solution. Shake well before drinking.    Drink 1 bottle of contrast @ 1:00 pm (2 hours prior to your exam)  Drink 1 bottle of contrast @ 2:00 pm (1 hour prior to your exam)  You may take any medications as prescribed with a small amount of water, if necessary. If you take any of the following medications: METFORMIN, GLUCOPHAGE, GLUCOVANCE, AVANDAMET, RIOMET, FORTAMET, [place] MET, JANUMET, GLUMETZA or METAGLIP, you MAY be asked to HOLD this medication 48 hours AFTER the exam.  The purpose of you drinking the oral contrast is to aid in the visualization of your intestinal tract. The contrast solution may cause some diarrhea. Depending on your individual set of symptoms, you may also receive an intravenous injection of x-ray contrast/dye. Plan on being at [facility] for 30 minutes or longer, depending on the type of exam you are having performed.  This test typically takes 30-45 minutes to complete.  If you have any questions  regarding your exam or if you need to reschedule, you may call the CT department at [phone] between the hours of 8:00 am and 5:00 pm, Monday-Friday.  ________________________________________________________  Please purchase the following medications over the  counter and take as directed: Miralax 17 grams (1 capful) twice daily dissolved in at least 8 ounces water/juice.  Senna daily  ________________________________________________________  We have sent the following medications to your pharmacy for you to pick up at your convenience: Bentyl 10 mg every 8 hours as needed ________________________________________________________  We have placed a referral for you to see Cardiology for heart calcifications, chest pain and shortness of breath. Your appointment is with Dr Irish Lack on 12/14/21 at 2:30 pm (2:15 pm arrival). Please make sure to bring current medications as well as insurance cards to your appointment.  [facility] at [id number]  [phone]  ________________________________________________________  We have placed a referral for you to see a [facility] provider so you can establish and maintain a relationship with one consistent provider. If you do not receive a call with an appointment within the next 2-3 weeks, please let us know. ________________________________________________________  If you are age 14 or older, your body mass index should be between 23-30. Your Body mass index is 24.89 kg/m. If this is out of the aforementioned range listed, please consider follow up with your Primary Care Provider.  If you are age 23 or younger, your body mass index should be between 19-25. Your Body mass index is 24.89 kg/m. If this is out of the aformentioned range listed, please consider  follow up with your Primary Care Provider.   ________________________________________________________  The [facility] providers would like to  encourage you to use [facility] to communicate with providers for non-urgent requests or questions.  Due to long hold times on the telephone, sending your provider a message by [facility] may be a faster and more efficient way to get a response.  Please allow 48 business hours for a response.  Please remember that this is for non-urgent requests.  _______________________________________________________  Due to recent changes in healthcare laws, you may see the results of your imaging and laboratory studies on MyChart before your provider has had a chance to review them.  We understand that in some cases there may be results that are confusing or concerning to you. Not all laboratory results come back in the same time frame and the provider may be waiting for multiple results in order to interpret others.  Please give [place] 48 hours in order for your provider to thoroughly review all the results before contacting the office for clarification of your results.

## 2021-12-14 ENCOUNTER — Ambulatory Visit: Payer: 59 | Admitting: Interventional Cardiology

## 2021-12-14 ENCOUNTER — Other Ambulatory Visit: Payer: 59

## 2021-12-15 MED ORDER — AMLODIPINE BESYLATE 5 MG PO TABS: 5.0000 mg | ORAL_TABLET | Freq: Every day | ORAL | 0 refills | Status: DC

## 2021-12-15 NOTE — Telephone Encounter (Signed)
Will add amlodipine '5mg'$  daily for patient's BP in addition to her current losartan. Rx sent to her pharmacy. She will still need office visit to follow up on this.

## 2021-12-16 NOTE — Telephone Encounter (Signed)
Patient returns call to nurse line.   Patient advised of Amlodipine. Patient reports she picked this up yesterday.   Patient scheduled for 9/13.

## 2021-12-16 NOTE — Telephone Encounter (Signed)
Called patient to inform of below. Patient did not answer. LVM asking patient to return call to office.   Talbot Grumbling, RN

## 2021-12-20 ENCOUNTER — Ambulatory Visit (HOSPITAL_COMMUNITY)
Admission: RE | Admit: 2021-12-20 | Discharge: 2021-12-20 | Disposition: A | Discharge status: Home / Self Care | Payer: 59 | Source: Ambulatory Visit | Attending: Gastroenterology | Admitting: Gastroenterology

## 2021-12-20 ENCOUNTER — Encounter (HOSPITAL_COMMUNITY): Payer: Self-pay

## 2021-12-20 DIAGNOSIS — R109 Unspecified abdominal pain: Secondary | ICD-10-CM | POA: Insufficient documentation

## 2021-12-20 DIAGNOSIS — K219 Gastro-esophageal reflux disease without esophagitis: Secondary | ICD-10-CM | POA: Insufficient documentation

## 2021-12-20 DIAGNOSIS — R634 Abnormal weight loss: Secondary | ICD-10-CM | POA: Insufficient documentation

## 2021-12-20 DIAGNOSIS — N281 Cyst of kidney, acquired: Secondary | ICD-10-CM | POA: Diagnosis not present

## 2021-12-20 DIAGNOSIS — I2510 Atherosclerotic heart disease of native coronary artery without angina pectoris: Secondary | ICD-10-CM | POA: Insufficient documentation

## 2021-12-20 DIAGNOSIS — K5900 Constipation, unspecified: Secondary | ICD-10-CM | POA: Diagnosis not present

## 2021-12-20 DIAGNOSIS — I10 Essential (primary) hypertension: Secondary | ICD-10-CM | POA: Diagnosis not present

## 2021-12-20 MED ORDER — IOHEXOL 300 MG/ML  SOLN
100.0000 mL | Freq: Once | INTRAMUSCULAR | Status: AC | PRN
  Administered 2021-12-20: 100 mL via INTRAVENOUS

## 2021-12-22 ENCOUNTER — Other Ambulatory Visit: Payer: Self-pay

## 2021-12-22 ENCOUNTER — Telehealth: Payer: Self-pay | Admitting: Gastroenterology

## 2021-12-22 NOTE — Telephone Encounter (Signed)
Patient called and wanted the results of her abdomen CT given to her. Please advise, thank you.

## 2021-12-22 NOTE — Telephone Encounter (Signed)
Returned call to patient. See 12/20/21 CT result note for details.

## 2021-12-22 NOTE — Progress Notes (Signed)
Patient appearing on report for [employer] [facility] - Hypertension Control report due to last documented ambulatory blood pressure of 142/86 mmHg on 12/03/2021. Next appointment with PCP is 01/12/2022.   Outreached patient to discuss hypertension control and medication management. Left voicemail for patient to return my call at their convenience.   Joseph Art, Pharm.D. PGY-2 Ambulatory Care Pharmacy Resident 12/22/2021 9:40 AM

## 2022-01-04 ENCOUNTER — Other Ambulatory Visit: Payer: Self-pay | Admitting: *Deleted

## 2022-01-04 MED ORDER — TRELEGY ELLIPTA 200-62.5-25 MCG/ACT IN AEPB: 1.0000 | INHALATION_SPRAY | Freq: Every day | RESPIRATORY_TRACT | 0 refills | Status: DC

## 2022-01-12 ENCOUNTER — Ambulatory Visit: Payer: 59 | Admitting: Family Medicine

## 2022-01-14 ENCOUNTER — Encounter: Payer: Self-pay | Admitting: *Deleted

## 2022-01-14 ENCOUNTER — Other Ambulatory Visit: Payer: Self-pay | Admitting: Gastroenterology

## 2022-01-14 ENCOUNTER — Encounter: Payer: Self-pay | Admitting: Interventional Cardiology

## 2022-01-14 ENCOUNTER — Ambulatory Visit: Payer: 59 | Attending: Interventional Cardiology | Admitting: Interventional Cardiology

## 2022-01-14 VITALS — BP 138/72 | HR 68 | Ht 61.75 in | Wt 138.8 lb

## 2022-01-14 DIAGNOSIS — I2584 Coronary atherosclerosis due to calcified coronary lesion: Secondary | ICD-10-CM | POA: Diagnosis not present

## 2022-01-14 DIAGNOSIS — R072 Precordial pain: Secondary | ICD-10-CM | POA: Diagnosis not present

## 2022-01-14 DIAGNOSIS — I2510 Atherosclerotic heart disease of native coronary artery without angina pectoris: Secondary | ICD-10-CM

## 2022-01-14 DIAGNOSIS — I700 Atherosclerosis of aorta: Secondary | ICD-10-CM

## 2022-01-14 DIAGNOSIS — Z720 Tobacco use: Secondary | ICD-10-CM | POA: Diagnosis not present

## 2022-01-14 MED ORDER — METOPROLOL TARTRATE 100 MG PO TABS: ORAL_TABLET | ORAL | 0 refills | Status: DC

## 2022-01-14 MED ORDER — ROSUVASTATIN CALCIUM 20 MG PO TABS: 20.0000 mg | ORAL_TABLET | Freq: Every day | ORAL | 3 refills | Status: DC

## 2022-01-14 NOTE — Patient Instructions (Addendum)
Medication Instructions:  Your physician has recommended you make the following change in your medication: Start Rosuvastatin 20 mg by mouth daily    *If you need a refill on your cardiac medications before your next appointment, please call your pharmacy*   Lab Work: Lab work to be done today--BMP  Your physician recommends that you return for lab work on April 14, 2022.  Lipid and Liver profiles.  This will be fasting.  The lab opens at 7:15 AM  If you have labs (blood work) drawn today and your tests are completely normal, you will receive your results only by: [place] (if you have MyChart) OR A paper copy in the mail If you have any lab test that is abnormal or we need to change your treatment, we will call you to review the results.   Testing/Procedures: Your physician has requested that you have cardiac CT. Cardiac computed tomography (CT) is a painless test that uses an x-ray machine to take clear, detailed pictures of your heart. For further information please visit [email or web address]. Please follow instruction sheet as given.     Follow-Up: At [facility], you and your health needs are our priority.  As part of our continuing mission to provide you with exceptional heart care, we have created designated Provider Care Teams.  These Care Teams include your primary Cardiologist (physician) and Advanced Practice Providers (APPs -  Physician Assistants and Nurse Practitioners) who all work together to provide you with the care you need, when you need it.  We recommend signing up for the patient portal called "MyChart".  Sign up information is provided on this After Visit Summary.  MyChart is used to connect with patients for Virtual Visits (Telemedicine).  Patients are able to view lab/test results, encounter notes, upcoming appointments, etc.  Non-urgent messages can be sent to your provider as well.   To learn more about what you can do with MyChart, go to  [email or web address].    Your next appointment:   12 month(s)  The format for your next appointment:   In Person  Provider:   Larae Grooms, MD     Other Instructions   Your cardiac CT will be scheduled at one of the below locations:   [facility] [address] [place], [place] [address] [phone]  [place] [address] [place], [place] [address] [phone]  [facility] [place], [place] [address] [phone]  If scheduled at [facility], please arrive at the [facility] and Children's Entrance (Entrance C2) of [facility] 30 minutes prior to test start time. You can use the FREE valet parking offered at entrance C (encouraged to control the heart rate for the test)  Proceed to the [facility] Radiology Department (first floor) to check-in and test prep.  All radiology patients and guests should use entrance C2 at [facility], accessed from [facility], even though the hospital's physical address listed is [address].    If scheduled at [facility] or [facility], please arrive 15 mins early for check-in and test prep.   Please follow these instructions carefully (unless otherwise directed):  Hold all erectile dysfunction medications at least 3 days (72 hrs) prior to test. (Ie viagra, cialis, sildenafil, tadalafil, etc) We will administer nitroglycerin during this exam.   On the Night Before the Test: Be sure  to Drink plenty of water. Do not consume any caffeinated/decaffeinated beverages or chocolate 12 hours prior to your test. Do not take any antihistamines 12 hours prior to your test.   On the Day of the Test: Drink plenty of water until 1 hour prior to the test. You may take your regular medications prior to the test.  Take metoprolol  (Lopressor) two hours prior to test. HOLD Furosemide/Hydrochlorothiazide morning of the test. FEMALES- please wear underwire-free bra if available, avoid dresses & tight clothing        After the Test: Drink plenty of water. After receiving IV contrast, you may experience a mild flushed feeling. This is normal. On occasion, you may experience a mild rash up to 24 hours after the test. This is not dangerous. If this occurs, you can take Benadryl 25 mg and increase your fluid intake. If you experience trouble breathing, this can be serious. If it is severe call 911 IMMEDIATELY. If it is mild, please call our office. If you take any of these medications: Glipizide/Metformin, Avandament, Glucavance, please do not take 48 hours after completing test unless otherwise instructed.  We will call to schedule your test 2-4 weeks out understanding that some insurance companies will need an authorization prior to the service being performed.   For non-scheduling related questions, please contact the cardiac imaging nurse navigator should you have any questions/concerns: Marchia Bond, Cardiac Imaging Nurse Navigator Gordy Clement, Cardiac Imaging Nurse Navigator [facility] Direct Office Dial: [phone]   For scheduling needs, including cancellations and rescheduling, please call [place], [phone].   Important Information About Sugar

## 2022-01-14 NOTE — Progress Notes (Signed)
Cardiology Office Note   Date:  01/14/2022   ID:  Julie Hensley, DOB 1960/05/15, MRN [id number]  PCP:  Alcus Dad, MD    No chief complaint on file.  Coronary artery calcification  Wt Readings from Last 3 Encounters:  01/14/22 138 lb 12.8 oz (63 kg)  12/13/21 135 lb (61.2 kg)  12/03/21 137 lb 9.6 oz (62.4 kg)       History of Present Illness: Julie Hensley is a 61 y.o. female who is being seen today for the evaluation of heart calcifications, chest pain and shortness of breath. at the request of Alcus Dad, MD.   She had a CT scan in August 2022 revealing coronary artery calcification: "Atherosclerotic calcification of the aorta and coronary arteries."  Abdominal CT scan in August 2023 showed: "Atherosclerosis is present, including aortoiliac atherosclerotic disease. Today's exam is not a CT angiogram but we do not demonstrate critical stenosis or occlusion of the celiac trunk or SMA to indicate chronic mesenteric ischemia as a cause for weight loss."  She has had some left shoulder pain, worse at work.  She works in a [employer] that is pretty physically demanding.  She does a lot of lifting.   She continues to smoke, now over 1/2 ppd.  Family h/o CAD, in grandmother and mother.   Denies : Dizziness. Leg edema. Nitroglycerin use. Orthopnea. Palpitations. Paroxysmal nocturnal dyspnea. Shortness of breath. Syncope.      Past Medical History:  Diagnosis Date   Acid reflux    Anxiety    Arthritis    Asthma 03/04/2021   Chronic headaches    Complication of anesthesia    COPD (chronic obstructive pulmonary disease) (HCC)    GERD (gastroesophageal reflux disease)    Hypertension    HBP at [employer] machine but never been to doctor for it.    PONV (postoperative nausea and vomiting)     Past Surgical History:  Procedure Laterality Date   APPENDECTOMY     EXPLORATORY LAPAROTOMY     MANDIBLE FRACTURE SURGERY     TONSILLECTOMY       Current  Outpatient Medications  Medication Sig Dispense Refill   acetaminophen (TYLENOL) 500 MG tablet Take 1,000-1,500 mg by mouth daily as needed for headache.     albuterol (VENTOLIN HFA) 108 (90 Base) MCG/ACT inhaler INHALE 2 PUFFS INTO LUNGS EVERY 4 HOURS AS NEEDED 18 each 2   amLODipine (NORVASC) 5 MG tablet Take 1 tablet (5 mg total) by mouth at bedtime. 90 tablet 0   dicyclomine (BENTYL) 20 MG tablet Take 20 mg by mouth 2 (two) times daily.     Docusate Sodium (EQ STOOL SOFTENER PO) Take 1 tablet by mouth daily.     Fluticasone-Umeclidin-Vilant (TRELEGY ELLIPTA) 200-62.5-25 MCG/ACT AEPB Inhale 1 puff into the lungs daily. 60 each 0   losartan (COZAAR) 50 MG tablet TAKE 1 TABLET BY MOUTH EVERY DAY 30 tablet 0   omeprazole (PRILOSEC) 40 MG capsule Take 1 capsule (40 mg total) by mouth daily. 90 capsule 0   polyethylene glycol powder (GLYCOLAX/MIRALAX) 17 GM/SCOOP powder Take 17 g by mouth daily. 850 g 0   senna (SENOKOT) 8.6 MG TABS tablet Take 1 tablet (8.6 mg total) by mouth daily. 90 tablet 0   traZODone (DESYREL) 100 MG tablet Take 1 tablet (100 mg total) by mouth at bedtime as needed for sleep. 90 tablet 0   No current facility-administered medications for this visit.    Allergies:   Patient has  no known allergies.    Social History:  The patient  reports that she has been smoking cigarettes. She has a 20.00 pack-year smoking history. She has never used smokeless tobacco. She reports that she does not drink alcohol and does not use drugs.   Family History:  The patient's family history includes COPD in her mother; Diabetes in her paternal grandmother; Heart disease in her father, maternal grandfather, maternal grandmother, mother, paternal grandfather, and paternal grandmother; Hernia in her brother, brother, and mother; Hyperlipidemia in her sister; Hypertension in her brother, brother, brother, brother, father, mother, and sister; Stroke in her father; Thyroid disease in her mother.     ROS:  Please see the history of present illness.   Otherwise, review of systems are positive for gets some anxiety from the nicotine patches- ; .   All other systems are reviewed and negative.    PHYSICAL EXAM: VS:  BP 138/72   Pulse 68   Ht 5' 1.75" (1.568 m)   Wt 138 lb 12.8 oz (63 kg)   SpO2 96%   BMI 25.59 kg/m  , BMI Body mass index is 25.59 kg/m. GEN: Well nourished, well developed, in no acute distress HEENT: normal Neck: no JVD, carotid bruits, or masses Cardiac: RRR; no murmurs, rubs, or gallops,no edema  Respiratory:  clear to auscultation bilaterally, normal work of breathing GI: soft, nontender, nondistended, + BS MS: no deformity or atrophy Skin: warm and dry, no rash Neuro:  Strength and sensation are intact Psych: euthymic mood, full affect   EKG:   The ekg ordered today demonstrates normal sinus rhythm, nonspecific ST-T wave changes inferiorly   Recent Labs: 11/05/2021: ALT 9; Hemoglobin 15.0; Platelets 260; TSH 1.340 12/03/2021: BUN 7; Creatinine, Ser 0.78; Potassium 3.8; Sodium 145   Lipid Panel    Component Value Date/Time   CHOL 201 (H) 06/10/2014 1015   TRIG 200 (H) 06/10/2014 1015   HDL 44 06/10/2014 1015   CHOLHDL 4.6 06/10/2014 1015   VLDL 40 06/10/2014 1015   LDLCALC 117 (H) 06/10/2014 1015     Other studies Reviewed: Additional studies/ records that were reviewed today with results demonstrating: CT scans and labs reviewed.   ASSESSMENT AND PLAN:  Coronary artery calcification: Also having some left shoulder/chest pain.  Plan for CTA coronaries to evaluate for significant CAD.  Metoprolol 100 mg prior to CT scan.  If she feels better on the metoprolol in terms of less stressed due to slower heart rate, could continue this as a blood pressure medicine.  She will let us know. Aortic atherosclerosis: Start rosuvastatin 20 mg daily.  Check labs in 3 months..  Tobacco abuse: We spoke about the importance of stopping smoking.  I asked her to  consider trying the lowest dose nicotine patch.  Could also consider Chantix. Hyperlipidemia: Hyperlipidemia in 2016.  Start rosuvastatin.  She will need follow-up lipids in 3 months.  Whole food, plant-based diet.  High-fiber foods.  Avoid processed foods.   Current medicines are reviewed at length with the patient today.  The patient concerns regarding her medicines were addressed.  The following changes have been made:  No change  Labs/ tests ordered today include:  No orders of the defined types were placed in this encounter.   Recommend 150 minutes/week of aerobic exercise Low fat, low carb, high fiber diet recommended  Disposition:   FU in 1 year or sooner if CTA is abnormal   Signed, Larae Grooms, MD  01/14/2022 1:49 PM  [facility] [place], [place], [place]  [address] Phone: [phone]; Fax: [phone]

## 2022-01-23 ENCOUNTER — Other Ambulatory Visit: Payer: Self-pay | Admitting: Family Medicine

## 2022-01-23 DIAGNOSIS — I10 Essential (primary) hypertension: Secondary | ICD-10-CM

## 2022-01-26 LAB — BASIC METABOLIC PANEL
BUN/Creatinine Ratio: 17 (ref 12–28)
BUN: 12 mg/dL (ref 8–27)
CO2: 22 mmol/L (ref 20–29)
Calcium: 9.8 mg/dL (ref 8.7–10.3)
Chloride: 103 mmol/L (ref 96–106)
Creatinine, Ser: 0.7 mg/dL (ref 0.57–1.00)
Glucose: 81 mg/dL (ref 70–99)
Potassium: 4 mmol/L (ref 3.5–5.2)
Sodium: 144 mmol/L (ref 134–144)
eGFR: 98 mL/min/{1.73_m2} (ref >59)

## 2022-01-28 ENCOUNTER — Other Ambulatory Visit: Payer: Self-pay | Admitting: Family Medicine

## 2022-01-28 DIAGNOSIS — J449 Chronic obstructive pulmonary disease, unspecified: Secondary | ICD-10-CM

## 2022-02-03 ENCOUNTER — Ambulatory Visit (HOSPITAL_COMMUNITY): Payer: 59

## 2022-02-04 ENCOUNTER — Telehealth (HOSPITAL_COMMUNITY): Payer: Self-pay | Admitting: Emergency Medicine

## 2022-02-04 NOTE — Telephone Encounter (Signed)
Reaching out to patient to offer assistance regarding upcoming cardiac imaging study; pt verbalizes understanding of appt date/time, parking situation and where to check in, pre-test NPO status and medications ordered, and verified current allergies; name and call back number provided for further questions should they arise Julie Bond RN Navigator Cardiac Imaging [facility] [phone] office [phone] cell  Arrival 1130 w/c entrance Denies iv issues '100mg'$  metoprolol tartrate

## 2022-02-04 NOTE — Telephone Encounter (Signed)
Attempted to call patient regarding upcoming cardiac CT appointment. °Left message on voicemail with name and callback number °Jessia Kief RN Navigator Cardiac Imaging °[facility] °[phone] Office °[phone] Cell ° °

## 2022-02-05 ENCOUNTER — Other Ambulatory Visit: Payer: Self-pay | Admitting: Pulmonary Disease

## 2022-02-05 ENCOUNTER — Other Ambulatory Visit: Payer: Self-pay | Admitting: Family Medicine

## 2022-02-05 ENCOUNTER — Other Ambulatory Visit: Payer: Self-pay | Admitting: Gastroenterology

## 2022-02-05 DIAGNOSIS — F99 Mental disorder, not otherwise specified: Secondary | ICD-10-CM

## 2022-02-05 DIAGNOSIS — F411 Generalized anxiety disorder: Secondary | ICD-10-CM

## 2022-02-06 ENCOUNTER — Other Ambulatory Visit: Payer: Self-pay

## 2022-02-07 ENCOUNTER — Other Ambulatory Visit: Payer: Self-pay | Admitting: Family Medicine

## 2022-02-07 ENCOUNTER — Ambulatory Visit (HOSPITAL_COMMUNITY)
Admission: RE | Admit: 2022-02-07 | Discharge: 2022-02-07 | Disposition: A | Discharge status: Home / Self Care | Payer: 59 | Source: Ambulatory Visit | Attending: Interventional Cardiology | Admitting: Interventional Cardiology

## 2022-02-07 ENCOUNTER — Telehealth: Payer: Self-pay | Admitting: Pulmonary Disease

## 2022-02-07 DIAGNOSIS — I700 Atherosclerosis of aorta: Secondary | ICD-10-CM

## 2022-02-07 DIAGNOSIS — I10 Essential (primary) hypertension: Secondary | ICD-10-CM

## 2022-02-07 DIAGNOSIS — R072 Precordial pain: Secondary | ICD-10-CM | POA: Insufficient documentation

## 2022-02-07 MED ORDER — TRELEGY ELLIPTA 200-62.5-25 MCG/ACT IN AEPB: 1.0000 | INHALATION_SPRAY | Freq: Every day | RESPIRATORY_TRACT | 0 refills | Status: DC

## 2022-02-07 MED ORDER — NITROGLYCERIN 0.4 MG SL SUBL
SUBLINGUAL_TABLET | SUBLINGUAL | Status: AC
  Filled 2022-02-07: qty 2

## 2022-02-07 MED ORDER — IOHEXOL 350 MG/ML SOLN
95.0000 mL | Freq: Once | INTRAVENOUS | Status: AC | PRN
  Administered 2022-02-07: 95 mL via INTRAVENOUS

## 2022-02-07 MED ORDER — NITROGLYCERIN 0.4 MG SL SUBL
0.8000 mg | SUBLINGUAL_TABLET | Freq: Once | SUBLINGUAL | Status: AC
  Administered 2022-02-07: 0.8 mg via SUBLINGUAL

## 2022-02-07 NOTE — Telephone Encounter (Signed)
Refill sent. But advised patient that for any more refills she has to keep her follow up appt next month. Patient verbalized understanding. Nothing further needed

## 2022-02-08 ENCOUNTER — Telehealth: Payer: Self-pay

## 2022-02-08 ENCOUNTER — Telehealth: Payer: Self-pay | Admitting: Interventional Cardiology

## 2022-02-08 ENCOUNTER — Telehealth: Payer: Self-pay | Admitting: Gastroenterology

## 2022-02-08 DIAGNOSIS — R109 Unspecified abdominal pain: Secondary | ICD-10-CM

## 2022-02-08 DIAGNOSIS — K219 Gastro-esophageal reflux disease without esophagitis: Secondary | ICD-10-CM

## 2022-02-08 DIAGNOSIS — K5900 Constipation, unspecified: Secondary | ICD-10-CM

## 2022-02-08 DIAGNOSIS — R634 Abnormal weight loss: Secondary | ICD-10-CM

## 2022-02-08 NOTE — Telephone Encounter (Signed)
Cardiac clearance request sent , see alternate telephone encounter for details.

## 2022-02-08 NOTE — Telephone Encounter (Signed)
-----   Message from Jettie Booze, MD sent at 02/07/2022  8:44 PM EDT ----- Mild, nonobstructive CAD. Aortic atherosclerosis.  Continue Crestor along with healthy lifestyle. F/u in 1 year

## 2022-02-08 NOTE — Telephone Encounter (Signed)
Dr. Irish Lack, you recently saw this patient on 01/14/2022 and she had CCTA on 10/9 that did not show significant CAD. Could you please comment on your agreement for patient to proceed with upcoming EGD/colonoscopy.  Please send your response to p cv div preop  Thank you, Emmaline Life, NP-C 02/08/2022, 4:19 PM [address], [address] Office [phone] Fax [phone]

## 2022-02-08 NOTE — Telephone Encounter (Signed)
I spoke with patient and reviewed results with her.  

## 2022-02-08 NOTE — Telephone Encounter (Signed)
[facility] Pre-operative Risk Assessment     Request for surgical clearance:     Endoscopy Procedure  What type of surgery is being performed?     Colonoscopy and Endoscopy   When is this surgery scheduled?     TBD  What type of clearance is required ? Medical clearance  Are there any medications that need to be held prior to surgery and how long? N/A  Practice name and name of physician performing surgery?      [facility]  What is your office phone and fax number?      Phone- [phone]  [phone]  Anesthesia type (None, local, MAC, general) ?       MAC

## 2022-02-08 NOTE — Telephone Encounter (Signed)
Patient called states she already did everything she needed with her cardiologist and they asked for our office to send clearance for her procedure.

## 2022-02-08 NOTE — Telephone Encounter (Signed)
Pt returning nurses call regarding results. Please advise 

## 2022-02-09 ENCOUNTER — Encounter: Payer: Self-pay | Admitting: Emergency Medicine

## 2022-02-09 ENCOUNTER — Encounter: Payer: Self-pay | Admitting: Gastroenterology

## 2022-02-09 ENCOUNTER — Ambulatory Visit
Admission: EM | Admit: 2022-02-09 | Discharge: 2022-02-09 | Disposition: A | Discharge status: Home / Self Care | Payer: 59 | Attending: Family Medicine | Admitting: Family Medicine

## 2022-02-09 DIAGNOSIS — R112 Nausea with vomiting, unspecified: Secondary | ICD-10-CM

## 2022-02-09 DIAGNOSIS — R42 Dizziness and giddiness: Secondary | ICD-10-CM

## 2022-02-09 DIAGNOSIS — R519 Headache, unspecified: Secondary | ICD-10-CM

## 2022-02-09 MED ORDER — ONDANSETRON 4 MG PO TBDP: 4.0000 mg | ORAL_TABLET | Freq: Three times a day (TID) | ORAL | 0 refills | Status: DC | PRN

## 2022-02-09 MED ORDER — NA SULFATE-K SULFATE-MG SULF 17.5-3.13-1.6 GM/177ML PO SOLN: 1.0000 | ORAL | 0 refills | Status: DC

## 2022-02-09 MED ORDER — KETOROLAC TROMETHAMINE 30 MG/ML IJ SOLN
30.0000 mg | Freq: Once | INTRAMUSCULAR | Status: AC
  Administered 2022-02-09: 30 mg via INTRAMUSCULAR

## 2022-02-09 MED ORDER — ONDANSETRON 8 MG PO TBDP
8.0000 mg | ORAL_TABLET | Freq: Once | ORAL | Status: AC
  Administered 2022-02-09: 8 mg via ORAL

## 2022-02-09 NOTE — Discharge Instructions (Signed)
The medication that we gave you in your shot should keep working for about 48 hours, throughout this time avoid ibuprofen, Aleve, Advil as they are similar medications but you can take Tylenol up to 1000 mg every 6 hours as needed.  Take the Zofran every 6-8 hours to help with your nausea and hopefully encourage food and drink.  Go to the emergency department if you severely worsen at any time.

## 2022-02-09 NOTE — Telephone Encounter (Signed)
Called and spoke iwht patient. She has been cleared by cardiology. Pt has been scheduled for ECL in the [place] on Thursday, 02/17/22 at 2:30 pm. Pt has been advised that she will need to arrive in the [facility] by 1:30 pm with a care partner. Pt would like prep sent to pharmacy on file, she knows to pick up prep within the next couple of days. Pt confirms that she has access to MyChart and knows that I will send her instructions there. Pt verbalized understanding of all information and had no concerns at the end of the call.  Ambulatory referral to GI in epic. Secure message sent to pre-cert team since procedure is next week. SUPREP sent to pharmacy on file. Procedure instructions sent to patient via MyChart.

## 2022-02-09 NOTE — ED Provider Notes (Signed)
[facility]    CSN: [id number] Arrival date & time: 02/09/22  1209      History   Chief Complaint No chief complaint on file.   HPI Julie Hensley is a 61 y.o. female.   Patient presenting today with fatigue, weakness, occasional lightheadedness, significant frontal headache, nausea, vomiting that started yesterday.  Denies fever, chills, body aches, chest pain, shortness of breath, palpitations, severe abdominal pain, diarrhea or constipation.  Trying Tylenol and BC powder, BC powder takes the headache away for short period of time.  No known history of headache disorders, no sick contacts that she is aware of and no medication changes or diet changes recently.    Past Medical History:  Diagnosis Date   Acid reflux    Anxiety    Arthritis    Asthma 03/04/2021   Chronic headaches    Complication of anesthesia    COPD (chronic obstructive pulmonary disease) (HCC)    GERD (gastroesophageal reflux disease)    Hypertension    HBP at [employer] machine but never been to doctor for it.    PONV (postoperative nausea and vomiting)     Patient Active Problem List   Diagnosis Date Noted   Abdominal pain 12/05/2021   Asthma 03/04/2021   Cognitive impairment 07/02/2020   Suspected chronic obstructive pulmonary disease based on initial evaluation ([place]) 07/01/2019   Unintentional weight loss 07/01/2014   Insomnia 06/10/2014   Essential hypertension 06/10/2014   Tobacco abuse 06/10/2014   Generalized anxiety disorder 06/10/2014   Esophageal reflux 06/10/2014    Past Surgical History:  Procedure Laterality Date   APPENDECTOMY     EXPLORATORY LAPAROTOMY     MANDIBLE FRACTURE SURGERY     TONSILLECTOMY      OB History     Gravida  1   Para  1   Term  1   Preterm  0   AB  0   Living  1      SAB  0   IAB  0   Ectopic  0   Multiple  0   Live Births  1            Home Medications    Prior to Admission medications   Medication Sig Start  Date End Date Taking? Authorizing Provider  ondansetron (ZOFRAN-ODT) 4 MG disintegrating tablet Take 1 tablet (4 mg total) by mouth every 8 (eight) hours as needed for nausea or vomiting. 02/09/22  Yes Volney American, PA-C  acetaminophen (TYLENOL) 500 MG tablet Take 1,000-1,500 mg by mouth daily as needed for headache.    [provider]  albuterol (VENTOLIN HFA) 108 (90 Base) MCG/ACT inhaler INHALE 2 PUFFS INTO LUNGS UP TO EVERY 4 HOURS AS NEEDED 01/28/22   Alcus Dad, MD  amLODipine (NORVASC) 5 MG tablet Take 1 tablet (5 mg total) by mouth at bedtime. 12/15/21   Alcus Dad, MD  dicyclomine (BENTYL) 10 MG capsule TAKE 1 CAPSULE (10 MG TOTAL) BY MOUTH EVERY 8 (EIGHT) HOURS AS NEEDED FOR SPASMS. 02/06/22   Armbruster, Carlota Raspberry, MD  dicyclomine (BENTYL) 20 MG tablet Take 20 mg by mouth 2 (two) times daily.    [provider]  Docusate Sodium (EQ STOOL SOFTENER PO) Take 1 tablet by mouth daily.    [provider]  Fluticasone-Umeclidin-Vilant (TRELEGY ELLIPTA) 200-62.5-25 MCG/ACT AEPB Inhale 1 puff into the lungs daily. 02/07/22   Hunsucker, Bonna Gains, MD  losartan (COZAAR) 50 MG tablet TAKE 1 TABLET BY MOUTH  EVERY DAY 02/07/22   Alcus Dad, MD  metoprolol tartrate (LOPRESSOR) 100 MG tablet Take one tablet by mouth 2 hours prior to CT Scan 01/14/22   Jettie Booze, MD  Na Sulfate-K Sulfate-Mg Sulf 17.5-3.13-1.6 GM/177ML SOLN Take 1 kit by mouth as directed. 02/09/22   Armbruster, Carlota Raspberry, MD  omeprazole (PRILOSEC) 40 MG capsule Take 1 capsule (40 mg total) by mouth daily. 12/04/21   Alcus Dad, MD  polyethylene glycol powder (GLYCOLAX/MIRALAX) 17 GM/SCOOP powder Take 17 g by mouth daily. 12/04/21   Alcus Dad, MD  rosuvastatin (CRESTOR) 20 MG tablet Take 1 tablet (20 mg total) by mouth daily. 01/14/22   Jettie Booze, MD  senna (SENOKOT) 8.6 MG TABS tablet Take 1 tablet (8.6 mg total) by mouth daily. 12/04/21   Alcus Dad, MD  traZODone  (DESYREL) 100 MG tablet TAKE 1 TABLET BY MOUTH AT BEDTIME AS NEEDED FOR SLEEP. 02/07/22   Alcus Dad, MD    Family History Family History  Problem Relation Age of Onset   Hypertension Mother    Heart disease Mother    COPD Mother    Thyroid disease Mother    Hernia Mother    Heart disease Father    Hypertension Father    Stroke Father    Hypertension Sister    Hyperlipidemia Sister    Hypertension Brother    Hernia Brother    Hypertension Brother    Hypertension Brother    Hypertension Brother    Hernia Brother    Heart disease Maternal Grandmother    Heart disease Maternal Grandfather    Diabetes Paternal Grandmother    Heart disease Paternal Grandmother    Heart disease Paternal Grandfather    Anesthesia problems Neg Hx     Social History Social History   Tobacco Use   Smoking status: Every Day    Packs/day: 0.50    Years: 40.00    Total pack years: 20.00    Types: Cigarettes   Smokeless tobacco: Never   Tobacco comments:    0.5 ppd 12/09/2020  Vaping Use   Vaping Use: Never used  Substance Use Topics   Alcohol use: No    Alcohol/week: 0.0 standard drinks of alcohol    Comment: has not had alcohol in a month.  Previously a case of beer daily   Drug use: No     Allergies   Patient has no known allergies.   Review of Systems Review of Systems PER HPI  Physical Exam Triage Vital Signs ED Triage Vitals  Enc Vitals Group     BP 02/09/22 1218 132/84     Pulse Rate 02/09/22 1218 97     Resp 02/09/22 1218 16     Temp 02/09/22 1218 98.1 F (36.7 C)     Temp Source 02/09/22 1218 Oral     SpO2 02/09/22 1218 96 %     Weight --      Height --      Head Circumference --      Peak Flow --      Pain Score 02/09/22 1219 10     Pain Loc --      Pain Edu? --      Excl. in GC? --    Orthostatic VS for the past 24 hrs:  BP- Lying Pulse- Lying BP- Sitting Pulse- Sitting BP- Standing at 0 minutes Pulse- Standing at 0 minutes  02/09/22 1311 128/82 78  142/84 81 145/83 83    Updated  Vital Signs BP 132/84 (BP Location: Right Arm)   Pulse 97   Temp 98.1 F (36.7 C) (Oral)   Resp 16   SpO2 96%   Visual Acuity Right Eye Distance:   Left Eye Distance:   Bilateral Distance:    Right Eye Near:   Left Eye Near:    Bilateral Near:     Physical Exam Vitals and nursing note reviewed.  Constitutional:      Appearance: Normal appearance. She is not ill-appearing.  HENT:     Head: Atraumatic.     Mouth/Throat:     Mouth: Mucous membranes are moist.  Eyes:     Extraocular Movements: Extraocular movements intact.     Conjunctiva/sclera: Conjunctivae normal.     Pupils: Pupils are equal, round, and reactive to light.  Cardiovascular:     Rate and Rhythm: Normal rate and regular rhythm.     Heart sounds: Normal heart sounds.  Pulmonary:     Effort: Pulmonary effort is normal.     Breath sounds: Normal breath sounds.  Abdominal:     General: Bowel sounds are normal. There is no distension.     Palpations: Abdomen is soft.     Tenderness: There is abdominal tenderness. There is no right CVA tenderness, left CVA tenderness or guarding.     Comments: Mild epigastric tenderness to palpation without distention or guarding  Musculoskeletal:        General: Normal range of motion.     Cervical back: Normal range of motion and neck supple.  Skin:    General: Skin is warm and dry.  Neurological:     Mental Status: She is alert and oriented to person, place, and time. Mental status is at baseline.     Cranial Nerves: No cranial nerve deficit.     Motor: No weakness.     Gait: Gait normal.  Psychiatric:        Mood and Affect: Mood normal.        Thought Content: Thought content normal.        Judgment: Judgment normal.    UC Treatments / Results  Labs (all labs ordered are listed, but only abnormal results are displayed) Labs Reviewed - No data to display  EKG   Radiology No results found.  Procedures Procedures (including  critical care time)  Medications Ordered in UC Medications  ketorolac (TORADOL) 30 MG/ML injection 30 mg (30 mg Intramuscular Given 02/09/22 1309)  ondansetron (ZOFRAN-ODT) disintegrating tablet 8 mg (8 mg Oral Given 02/09/22 1308)    Initial Impression / Assessment and Plan / UC Course  I have reviewed the triage vital signs and the nursing notes.  Pertinent labs & imaging results that were available during my care of the patient were reviewed by me and considered in my medical decision making (see chart for details).     IM Toradol and Zofran given in clinic, after about 20 minutes her headache was subsiding and her nausea was improved to where she was sipping ice water without difficulty.  We will send Zofran to the pharmacy for as needed use, push fluids, rest.  Suspect viral illness but discussed to go to the emergency department if headache worsening or having new neurologic issues.  Orthostatic vital signs do not show significant change today and she declines EKG or other work-up for further evaluation at this time.  Supportive care and return precautions reviewed.  Work note given.  Final Clinical Impressions(s) / UC Diagnoses  Final diagnoses:  Nausea and vomiting, unspecified vomiting type  Acute nonintractable headache, unspecified headache type  Lightheaded     Discharge Instructions      The medication that we gave you in your shot should keep working for about 48 hours, throughout this time avoid ibuprofen, Aleve, Advil as they are similar medications but you can take Tylenol up to 1000 mg every 6 hours as needed.  Take the Zofran every 6-8 hours to help with your nausea and hopefully encourage food and drink.  Go to the emergency department if you severely worsen at any time.    ED Prescriptions     Medication Sig Dispense Auth. Provider   ondansetron (ZOFRAN-ODT) 4 MG disintegrating tablet Take 1 tablet (4 mg total) by mouth every 8 (eight) hours as needed for  nausea or vomiting. 20 tablet Volney American, [place]      PDMP not reviewed this encounter.   Volney American, [place] 02/09/22 1442

## 2022-02-09 NOTE — Telephone Encounter (Signed)
   Primary Cardiologist: Larae Grooms, MD  Chart reviewed as part of pre-operative protocol coverage. Given past medical history and time since last visit, based on ACC/AHA guidelines, Julie Hensley would be at acceptable risk for the planned procedure without further cardiovascular testing.   I will route this recommendation to the requesting party via Epic fax function and remove from pre-op pool.  Please call with questions.  Emmaline Life, NP-C  02/09/2022, 7:13 AM [address] Office [phone] Fax [phone]

## 2022-02-09 NOTE — ED Triage Notes (Signed)
No energy that started yesterday.  Headache, nauseated

## 2022-02-12 ENCOUNTER — Encounter: Payer: Self-pay | Admitting: Certified Registered Nurse Anesthetist

## 2022-02-17 ENCOUNTER — Encounter: Payer: Self-pay | Admitting: Gastroenterology

## 2022-02-17 ENCOUNTER — Ambulatory Visit (AMBULATORY_SURGERY_CENTER): Payer: 59 | Admitting: Gastroenterology

## 2022-02-17 VITALS — BP 119/97 | HR 71 | Temp 98.9°F | Resp 15 | Ht 61.75 in | Wt 135.0 lb

## 2022-02-17 DIAGNOSIS — D123 Benign neoplasm of transverse colon: Secondary | ICD-10-CM

## 2022-02-17 DIAGNOSIS — D122 Benign neoplasm of ascending colon: Secondary | ICD-10-CM

## 2022-02-17 DIAGNOSIS — D125 Benign neoplasm of sigmoid colon: Secondary | ICD-10-CM | POA: Diagnosis not present

## 2022-02-17 DIAGNOSIS — K219 Gastro-esophageal reflux disease without esophagitis: Secondary | ICD-10-CM

## 2022-02-17 DIAGNOSIS — K269 Duodenal ulcer, unspecified as acute or chronic, without hemorrhage or perforation: Secondary | ICD-10-CM | POA: Diagnosis not present

## 2022-02-17 DIAGNOSIS — R109 Unspecified abdominal pain: Secondary | ICD-10-CM

## 2022-02-17 DIAGNOSIS — D128 Benign neoplasm of rectum: Secondary | ICD-10-CM

## 2022-02-17 DIAGNOSIS — D127 Benign neoplasm of rectosigmoid junction: Secondary | ICD-10-CM

## 2022-02-17 DIAGNOSIS — D124 Benign neoplasm of descending colon: Secondary | ICD-10-CM

## 2022-02-17 DIAGNOSIS — Z1211 Encounter for screening for malignant neoplasm of colon: Secondary | ICD-10-CM

## 2022-02-17 DIAGNOSIS — K635 Polyp of colon: Secondary | ICD-10-CM | POA: Diagnosis not present

## 2022-02-17 DIAGNOSIS — K31A0 Gastric intestinal metaplasia, unspecified: Secondary | ICD-10-CM | POA: Diagnosis not present

## 2022-02-17 DIAGNOSIS — R634 Abnormal weight loss: Secondary | ICD-10-CM

## 2022-02-17 DIAGNOSIS — K317 Polyp of stomach and duodenum: Secondary | ICD-10-CM | POA: Diagnosis not present

## 2022-02-17 MED ORDER — SODIUM CHLORIDE 0.9 % IV SOLN: 500.0000 mL | INTRAVENOUS | Status: DC

## 2022-02-17 NOTE — Patient Instructions (Signed)
HANDOUTS PROVIDED ON: POLYPS & HEMORRHOIDS  The polyps removed/biopsies taken today have been sent for pathology.  The results can take 1-3 weeks to receive.  When your next colonoscopy should occur will be based on the pathology results.    You may resume your previous diet and medication schedule.  Thank you for allowing [place] to care for you today!!!   YOU HAD AN ENDOSCOPIC PROCEDURE TODAY AT [facility]:   Refer to the procedure report that was given to you for any specific questions about what was found during the examination.  If the procedure report does not answer your questions, please call your gastroenterologist to clarify.  If you requested that your care partner not be given the details of your procedure findings, then the procedure report has been included in a sealed envelope for you to review at your convenience later.  YOU SHOULD EXPECT: Some feelings of bloating in the abdomen. Passage of more gas than usual.  Walking can help get rid of the air that was put into your GI tract during the procedure and reduce the bloating. If you had a lower endoscopy (such as a colonoscopy or flexible sigmoidoscopy) you may notice spotting of blood in your stool or on the toilet paper. If you underwent a bowel prep for your procedure, you may not have a normal bowel movement for a few days.  Please Note:  You might notice some irritation and congestion in your nose or some drainage.  This is from the oxygen used during your procedure.  There is no need for concern and it should clear up in a day or so.  SYMPTOMS TO REPORT IMMEDIATELY:  Following lower endoscopy (colonoscopy or flexible sigmoidoscopy):  Excessive amounts of blood in the stool  Significant tenderness or worsening of abdominal pains  Swelling of the abdomen that is new, acute  Fever of 100F or higher  Following upper endoscopy (EGD)  Vomiting of blood or coffee ground material  New chest pain or pain under the  shoulder blades  Painful or persistently difficult swallowing  New shortness of breath  Fever of 100F or higher  Black, tarry-looking stools  For urgent or emergent issues, a gastroenterologist can be reached at any hour by calling [phone]. Do not use MyChart messaging for urgent concerns.    DIET:  We do recommend a small meal at first, but then you may proceed to your regular diet.  Drink plenty of fluids but you should avoid alcoholic beverages for 24 hours.  ACTIVITY:  You should plan to take it easy for the rest of today and you should NOT DRIVE or use heavy machinery until tomorrow (because of the sedation medicines used during the test).    FOLLOW UP: Our staff will call the number listed on your records the next business day following your procedure.  We will call around 7:15- 8:00 am to check on you and address any questions or concerns that you may have regarding the information given to you following your procedure. If we do not reach you, we will leave a message.     If any biopsies were taken you will be contacted by phone or by letter within the next 1-3 weeks.  Please call us at [phone] if you have not heard about the biopsies in 3 weeks.    SIGNATURES/CONFIDENTIALITY: You and/or your care partner have signed paperwork which will be entered into your electronic medical record.  These signatures attest to  the fact that that the information above on your After Visit Summary has been reviewed and is understood.  Full responsibility of the confidentiality of this discharge information lies with you and/or your care-partner.

## 2022-02-17 NOTE — Op Note (Signed)
[facility] Patient Name: Julie Hensley Procedure Date: 02/17/2022 2:27 PM MRN: [id number] Endoscopist: Remo Lipps P. Havery Moros , MD Age: 61 Referring MD:  Date of Birth: [birth year] Gender: Female Account #: [id number] Procedure:                Colonoscopy Indications:              Screening for colorectal malignant neoplasm, This                            is the patient's first colonoscopy Medicines:                Monitored Anesthesia Care Procedure:                Pre-Anesthesia Assessment:                           - Prior to the procedure, a History and Physical                            was performed, and patient medications and                            allergies were reviewed. The patient's tolerance of                            previous anesthesia was also reviewed. The risks                            and benefits of the procedure and the sedation                            options and risks were discussed with the patient.                            All questions were answered, and informed consent                            was obtained. Prior Anticoagulants: The patient has                            taken no previous anticoagulant or antiplatelet                            agents. ASA Grade Assessment: III - A patient with                            severe systemic disease. After reviewing the risks                            and benefits, the patient was deemed in                            satisfactory condition to undergo the procedure.  After obtaining informed consent, the colonoscope                            was passed under direct vision. Throughout the                            procedure, the patient's blood pressure, pulse, and                            oxygen saturations were monitored continuously. The                            Olympus PCF-H190DL [id number]) Colonoscope was                            introduced through  the anus and advanced to the the                            terminal ileum, with identification of the                            appendiceal orifice and IC valve. The colonoscopy                            was performed without difficulty. The patient                            tolerated the procedure well. The quality of the                            bowel preparation was adequate. The terminal ileum,                            ileocecal valve, appendiceal orifice, and rectum                            were photographed. Scope In: 2:59:25 PM Scope Out: 3:40:14 PM Scope Withdrawal Time: 0 hours 32 minutes 51 seconds  Total Procedure Duration: 0 hours 40 minutes 49 seconds  Findings:                 The perianal and digital rectal examinations were                            normal.                           The terminal ileum appeared normal.                           Two sessile polyps were found in the ascending                            colon. The polyps were 10 to 18 mm in size. These  polyps were removed with a cold snare. Resection                            and retrieval were complete.                           A 5 mm polyp was found in the hepatic flexure. The                            polyp was sessile. The polyp was removed with a                            cold snare. Resection and retrieval were complete.                           Four sessile polyps were found in the transverse                            colon. The polyps were 4 to 8 mm in size. These                            polyps were removed with a cold snare. Resection                            and retrieval were complete.                           Two sessile polyps were found in the descending                            colon. The polyps were 6 to 12 mm in size. These                            polyps were removed with a cold snare. Resection                            and retrieval  were complete, done during cecal                            intubation.Marland Kitchen Upon withdrawal there was persistent                            slow oozing at one of the sites. For hemostasis,                            one hemostatic clip was successfully placed. There                            was no bleeding at the end of the procedure.                           Multiple hyperplastic appearing polyps were noted  in the sigmoid colon. Two representative polyps 4                            to 5 mm in size were removed with a cold snare.                            Resection and retrieval were complete.                           Seven sessile polyps were found in the                            recto-sigmoid colon. The polyps were 3 to 5 mm in                            size. These polyps were removed with a cold snare.                            Resection and retrieval were complete.                           A 4 mm polyp was found in the rectum. The polyp was                            sessile. The polyp was removed with a cold snare.                            Resection and retrieval were complete.                           Internal hemorrhoids were found during                            retroflexion. The hemorrhoids were small.                           There was spasm in the entire colon which prolonged                            the exam.                           The exam was otherwise without abnormality. Complications:            No immediate complications. Estimated blood loss:                            Minimal. Estimated Blood Loss:     Estimated blood loss was minimal. Impression:               - The examined portion of the ileum was normal.                           - Two 10 to 18 mm polyps in the ascending colon,  removed with a cold snare. Resected and retrieved.                           - One 5 mm polyp at the hepatic flexure,  removed                            with a cold snare. Resected and retrieved.                           - Four 4 to 8 mm polyps in the transverse colon,                            removed with a cold snare. Resected and retrieved.                           - Two 6 to 12 mm polyps in the descending colon,                            removed with a cold snare. Resected and retrieved.                            Clip was placed.                           - Multiple suspected sigmoid hyperplastic polyps.                            Two 4 to 5 mm representative polyps removed with a                            cold snare. Resected and retrieved.                           - Seven 3 to 5 mm polyps at the recto-sigmoid                            colon, removed with a cold snare. Resected and                            retrieved.                           - One 4 mm polyp in the rectum, removed with a cold                            snare. Resected and retrieved.                           - Internal hemorrhoids.                           - Colonic spasm.                           -  The examination was otherwise normal. Recommendation:           - Patient has a contact number available for                            emergencies. The signs and symptoms of potential                            delayed complications were discussed with the                            patient. Return to normal activities tomorrow.                            Written discharge instructions were provided to the                            patient.                           - Resume previous diet.                           - Continue present medications.                           - Await pathology results. Remo Lipps P. Leviathan Macera, MD 02/17/2022 3:50:29 PM This report has been signed electronically.

## 2022-02-17 NOTE — Progress Notes (Signed)
Report given to PACU, vss 

## 2022-02-17 NOTE — Progress Notes (Signed)
[facility] History and Physical   Primary Care Physician:  Alcus Dad, MD   Reason for Procedure:   GERD, abdominal pain, weight loss, colon cancer screening  Plan:    EGD and colonoscopy   HPI: Julie Hensley is a 61 y.o. female  here for EGD and colonoscopy to evaluate issues as above. Has had cardiac workup since seen in the office. CT scan did not show cause for symptoms, suggestion of duodenal polyp noted. Omeprazole increased to '40mg'$  / day since last visit. It has helped reflux but not resolved it. Continues to have abdominal pain. First colonoscopy. Otherwise feels well without any cardiopulmonary symptoms.   I have discussed risks / benefits of anesthesia and endoscopic procedure with [name] and they wish to proceed with the exams as outlined today.    Past Medical History:  Diagnosis Date   Acid reflux    Anxiety    Arthritis    Asthma 03/04/2021   Chronic headaches    Complication of anesthesia    COPD (chronic obstructive pulmonary disease) (HCC)    GERD (gastroesophageal reflux disease)    Hypertension    HBP at [employer] machine but never been to doctor for it.    PONV (postoperative nausea and vomiting)     Past Surgical History:  Procedure Laterality Date   APPENDECTOMY     EXPLORATORY LAPAROTOMY     MANDIBLE FRACTURE SURGERY     TONSILLECTOMY      Prior to Admission medications   Medication Sig Start Date End Date Taking? Authorizing Provider  acetaminophen (TYLENOL) 500 MG tablet Take 1,000-1,500 mg by mouth daily as needed for headache.   Yes [provider]  albuterol (VENTOLIN HFA) 108 (90 Base) MCG/ACT inhaler INHALE 2 PUFFS INTO LUNGS UP TO EVERY 4 HOURS AS NEEDED 01/28/22  Yes Alcus Dad, MD  amLODipine (NORVASC) 5 MG tablet Take 1 tablet (5 mg total) by mouth at bedtime. 12/15/21  Yes Alcus Dad, MD  Docusate Sodium (EQ STOOL SOFTENER PO) Take 1 tablet by mouth daily.   Yes [provider]   Fluticasone-Umeclidin-Vilant (TRELEGY ELLIPTA) 200-62.5-25 MCG/ACT AEPB Inhale 1 puff into the lungs daily. 02/07/22  Yes Hunsucker, Bonna Gains, MD  losartan (COZAAR) 50 MG tablet TAKE 1 TABLET BY MOUTH EVERY DAY 02/07/22  Yes Alcus Dad, MD  omeprazole (PRILOSEC) 40 MG capsule Take 1 capsule (40 mg total) by mouth daily. 12/04/21  Yes Alcus Dad, MD  ondansetron (ZOFRAN-ODT) 4 MG disintegrating tablet Take 1 tablet (4 mg total) by mouth every 8 (eight) hours as needed for nausea or vomiting. 02/09/22  Yes Volney American, PA-C  polyethylene glycol powder [facility]) 17 GM/SCOOP powder Take 17 g by mouth daily. 12/04/21  Yes Alcus Dad, MD  rosuvastatin (CRESTOR) 20 MG tablet Take 1 tablet (20 mg total) by mouth daily. 01/14/22  Yes Jettie Booze, MD  senna (SENOKOT) 8.6 MG TABS tablet Take 1 tablet (8.6 mg total) by mouth daily. 12/04/21  Yes Alcus Dad, MD  traZODone (DESYREL) 100 MG tablet TAKE 1 TABLET BY MOUTH AT BEDTIME AS NEEDED FOR SLEEP. 02/07/22  Yes Alcus Dad, MD  dicyclomine (BENTYL) 10 MG capsule TAKE 1 CAPSULE (10 MG TOTAL) BY MOUTH EVERY 8 (EIGHT) HOURS AS NEEDED FOR SPASMS. Patient not taking: Reported on 02/17/2022 02/06/22   Yetta Flock, MD  dicyclomine (BENTYL) 20 MG tablet Take 20 mg by mouth 2 (two) times daily. Patient not taking: Reported on 02/17/2022    [provider]  metoprolol tartrate (LOPRESSOR) 100 MG tablet Take one tablet by mouth 2 hours prior to CT Scan 01/14/22   Jettie Booze, MD    Current Outpatient Medications  Medication Sig Dispense Refill   acetaminophen (TYLENOL) 500 MG tablet Take 1,000-1,500 mg by mouth daily as needed for headache.     albuterol (VENTOLIN HFA) 108 (90 Base) MCG/ACT inhaler INHALE 2 PUFFS INTO LUNGS UP TO EVERY 4 HOURS AS NEEDED 18 each 2   amLODipine (NORVASC) 5 MG tablet Take 1 tablet (5 mg total) by mouth at bedtime. 90 tablet 0   Docusate Sodium (EQ STOOL SOFTENER PO)  Take 1 tablet by mouth daily.     Fluticasone-Umeclidin-Vilant (TRELEGY ELLIPTA) 200-62.5-25 MCG/ACT AEPB Inhale 1 puff into the lungs daily. 60 each 0   losartan (COZAAR) 50 MG tablet TAKE 1 TABLET BY MOUTH EVERY DAY 30 tablet 0   omeprazole (PRILOSEC) 40 MG capsule Take 1 capsule (40 mg total) by mouth daily. 90 capsule 0   ondansetron (ZOFRAN-ODT) 4 MG disintegrating tablet Take 1 tablet (4 mg total) by mouth every 8 (eight) hours as needed for nausea or vomiting. 20 tablet 0   polyethylene glycol powder (GLYCOLAX/MIRALAX) 17 GM/SCOOP powder Take 17 g by mouth daily. 850 g 0   rosuvastatin (CRESTOR) 20 MG tablet Take 1 tablet (20 mg total) by mouth daily. 90 tablet 3   senna (SENOKOT) 8.6 MG TABS tablet Take 1 tablet (8.6 mg total) by mouth daily. 90 tablet 0   traZODone (DESYREL) 100 MG tablet TAKE 1 TABLET BY MOUTH AT BEDTIME AS NEEDED FOR SLEEP. 30 tablet 2   dicyclomine (BENTYL) 10 MG capsule TAKE 1 CAPSULE (10 MG TOTAL) BY MOUTH EVERY 8 (EIGHT) HOURS AS NEEDED FOR SPASMS. (Patient not taking: Reported on 02/17/2022) 30 capsule 1   dicyclomine (BENTYL) 20 MG tablet Take 20 mg by mouth 2 (two) times daily. (Patient not taking: Reported on 02/17/2022)     metoprolol tartrate (LOPRESSOR) 100 MG tablet Take one tablet by mouth 2 hours prior to CT Scan 1 tablet 0   Current Facility-Administered Medications  Medication Dose Route Frequency Provider Last Rate Last Admin   0.9 %  sodium chloride infusion  500 mL Intravenous Continuous Jayshaun Phillips, Carlota Raspberry, MD        Allergies as of 02/17/2022   (No Known Allergies)    Family History  Problem Relation Age of Onset   Hypertension Mother    Heart disease Mother    COPD Mother    Thyroid disease Mother    Hernia Mother    Heart disease Father    Hypertension Father    Stroke Father    Hypertension Sister    Hyperlipidemia Sister    Hypertension Brother    Hernia Brother    Hypertension Brother    Hypertension Brother    Hypertension  Brother    Hernia Brother    Heart disease Maternal Grandmother    Heart disease Maternal Grandfather    Diabetes Paternal Grandmother    Heart disease Paternal Grandmother    Heart disease Paternal Grandfather    Anesthesia problems Neg Hx    Colon cancer Neg Hx    Rectal cancer Neg Hx    Stomach cancer Neg Hx     Social History   Socioeconomic History   Marital status: Divorced    Spouse name: Not on file   Number of children: 1   Years of education: Not on file  Highest education level: Not on file  Occupational History   Occupation: [employer] in [place] Use   Smoking status: Every Day    Packs/day: 0.50    Years: 40.00    Total pack years: 20.00    Types: Cigarettes   Smokeless tobacco: Never   Tobacco comments:    0.5 ppd 12/09/2020  Vaping Use   Vaping Use: Never used  Substance and Sexual Activity   Alcohol use: No    Alcohol/week: 0.0 standard drinks of alcohol    Comment: has not had alcohol in a month.  Previously a case of beer daily   Drug use: No   Sexual activity: Yes    Birth control/protection: Post-menopausal    Comment: sex x1 in past year 2 weeks ago  Other Topics Concern   Not on file  Social History Narrative   Not on file   Social Determinants of Health   Financial Resource Strain: Not on file  Food Insecurity: Not on file  Transportation Needs: Unmet Transportation Needs (12/25/2019)   PRAPARE - Hydrologist (Medical): Yes    Lack of Transportation (Non-Medical): No  Physical Activity: Not on file  Stress: Stress Concern Present (12/25/2019)   [place]    Feeling of Stress : Very much  Social Connections: Not on file  Intimate Partner Violence: Not on file    Review of Systems: All other review of systems negative except as mentioned in the HPI.  Physical Exam: Vital signs BP (!) 146/75   Pulse 75   Temp 98.9 F  (37.2 C)   Ht 5' 1.75" (1.568 m)   Wt 135 lb (61.2 kg)   SpO2 96%   BMI 24.89 kg/m   General:   Alert,  Well-developed, pleasant and cooperative in NAD Lungs:  Clear throughout to auscultation.   Heart:  Regular rate and rhythm Abdomen:  Soft, nontender and nondistended.   Neuro/Psych:  Alert and cooperative. Normal mood and affect. A and O x 3  Jolly Mango, MD [facility]

## 2022-02-17 NOTE — Progress Notes (Signed)
1440 Robinul 0.1 mg IV given due large amount of secretions upon assessment.  MD made aware, vss

## 2022-02-17 NOTE — Progress Notes (Signed)
Called to room to assist during endoscopic procedure.  Patient ID and intended procedure confirmed with present staff. Received instructions for my participation in the procedure from the performing physician.  

## 2022-02-17 NOTE — Op Note (Signed)
[place] Patient Name: Mikisha Roseland Procedure Date: 02/17/2022 2:35 PM MRN: [id number] Endoscopist: Remo Lipps P. Havery Moros , MD Age: 61 Referring MD:  Date of Birth: Nov 20, 1960 Gender: Female Account #: [id number] Procedure:                Upper GI endoscopy Indications:              Abdominal pain, Follow-up of gastro-esophageal                            reflux disease, Weight loss - CT without overt                            pathology to cause symptoms. Question of duodenal                            lipoma noted on CT vs. other polypoid lesion.                            Reflux improved on omeprazole '40mg'$  / day but not                            resolved. Medicines:                Monitored Anesthesia Care Procedure:                Pre-Anesthesia Assessment:                           - Prior to the procedure, a History and Physical                            was performed, and patient medications and                            allergies were reviewed. The patient's tolerance of                            previous anesthesia was also reviewed. The risks                            and benefits of the procedure and the sedation                            options and risks were discussed with the patient.                            All questions were answered, and informed consent                            was obtained. Prior Anticoagulants: The patient has                            taken no previous anticoagulant or antiplatelet  agents. ASA Grade Assessment: III - A patient with                            severe systemic disease. After reviewing the risks                            and benefits, the patient was deemed in                            satisfactory condition to undergo the procedure.                           After obtaining informed consent, the endoscope was                            passed under direct vision. Throughout the                             procedure, the patient's blood pressure, pulse, and                            oxygen saturations were monitored continuously. The                            GIF [id number] #[id number] was introduced through the                            mouth, and advanced to the third part of duodenum.                            The upper GI endoscopy was accomplished without                            difficulty. The patient tolerated the procedure                            well. Scope In: Scope Out: Findings:                 Esophagogastric landmarks were identified: the                            Z-line was found at 35 cm, the gastroesophageal                            junction was found at 35 cm and the upper extent of                            the gastric folds was found at 35 cm from the                            incisors.                           The  exam of the esophagus was otherwise normal.                           The entire examined stomach was normal. Biopsies                            were taken with a cold forceps for Helicobacter                            pylori testing.                           Two 2 to 3 mm sessile polyps were found in the                            duodenal bulb. The polyp was removed with a cold                            biopsy forceps. Resection and retrieval were                            complete.                           There was a suspected lipoma in the distal duodenal                            bulb entering the duodenal sweep. Biopsies were                            taken with a cold forceps for histology, fatty                            tissue was apparent within the biopsy site.                           The exam of the duodenum was otherwise normal.                           Biopsies for histology were taken with a cold                            forceps in the duodenal bulb and in the second                             portion of the duodenum for evaluation of celiac                            disease. Complications:            No immediate complications. Estimated blood loss:                            Minimal. Estimated Blood Loss:     Estimated blood loss  was minimal. Impression:               - Esophagogastric landmarks identified.                           - Normal esophagus otherwise                           - Normal stomach. Biopsied.                           - Two duodenal polyps. Resected and retrieved.                           - Duodenal lipoma. Biopsied.                           - Normal duodenum otherwise                           - Biopsies were taken with a cold forceps for                            evaluation of celiac disease. Recommendation:           - Patient has a contact number available for                            emergencies. The signs and symptoms of potential                            delayed complications were discussed with the                            patient. Return to normal activities tomorrow.                            Written discharge instructions were provided to the                            patient.                           - Resume previous diet.                           - Continue present medications.                           - Trial of omeprazole twice daily if reflux                            symptoms persist                           - Await pathology results. Remo Lipps P. Dafna Romo, MD 02/17/2022 3:55:56 PM This report has been signed electronically.

## 2022-02-18 ENCOUNTER — Telehealth: Payer: Self-pay

## 2022-02-18 NOTE — Telephone Encounter (Signed)
  Follow up Call-     02/17/2022    1:50 PM  Call back number  Post procedure Call Back phone  # [phone]  Permission to leave phone message Yes     Left message

## 2022-02-23 ENCOUNTER — Other Ambulatory Visit: Payer: Self-pay

## 2022-02-23 MED ORDER — OMEPRAZOLE 40 MG PO CPDR: 40.0000 mg | DELAYED_RELEASE_CAPSULE | Freq: Two times a day (BID) | ORAL | 2 refills | Status: DC

## 2022-02-27 ENCOUNTER — Other Ambulatory Visit: Payer: Self-pay | Admitting: Family Medicine

## 2022-02-27 ENCOUNTER — Other Ambulatory Visit: Payer: Self-pay | Admitting: Pulmonary Disease

## 2022-02-28 ENCOUNTER — Other Ambulatory Visit: Payer: Self-pay

## 2022-02-28 ENCOUNTER — Telehealth: Payer: Self-pay | Admitting: Gastroenterology

## 2022-02-28 NOTE — Telephone Encounter (Addendum)
Patient called states her insurance won't pay for her Omeprazole 40 mg. States it needs a prior authorization and also states she called her pharmacy and they stated they don't have any medication on their end. Pharmacy I phone # [phone]. Please call to advise.

## 2022-03-01 ENCOUNTER — Other Ambulatory Visit: Payer: Self-pay | Admitting: Family Medicine

## 2022-03-01 ENCOUNTER — Other Ambulatory Visit: Payer: Self-pay | Admitting: Pulmonary Disease

## 2022-03-01 ENCOUNTER — Telehealth: Payer: Self-pay

## 2022-03-01 ENCOUNTER — Telehealth: Payer: Self-pay | Admitting: Pharmacy Technician

## 2022-03-01 ENCOUNTER — Other Ambulatory Visit (HOSPITAL_COMMUNITY): Payer: Self-pay

## 2022-03-01 DIAGNOSIS — I10 Essential (primary) hypertension: Secondary | ICD-10-CM

## 2022-03-01 MED ORDER — OMEPRAZOLE 40 MG PO CPDR: 40.0000 mg | DELAYED_RELEASE_CAPSULE | Freq: Two times a day (BID) | ORAL | 2 refills | Status: DC

## 2022-03-01 NOTE — Telephone Encounter (Signed)
PA has been submitted and telephone encounter has been created 

## 2022-03-01 NOTE — Telephone Encounter (Signed)
Patient Advocate Encounter  Received notification from [facility] that prior authorization for OMEPRAZOLE '40MG'$  is required.   PA submitted on 10.31.23 Key [id number] Status is pending    Luciano Cutter, CPhT Patient Advocate Phone: [phone]

## 2022-03-01 NOTE — Telephone Encounter (Signed)
Pt returned call. She states that she spoke with her insurance yesterday and was informed that she could get the increased dose if we submitted an authorization or called them. I informed pt that the PA team has already initiated a PA and it is currently pending. I told pt that it could take a few days before we get a response. Pt was under the impression that an RX was sent the other day for the increased dose, I explained to patient that we discussed her getting the prescription OTC since her insurance would not cover. I told pt that I will send RX and we will await response from her insurance.

## 2022-03-01 NOTE — Telephone Encounter (Signed)
Received fax from [facility] stating that patient requested an RX  for Omeprazole  40 mg BID. When I spoke with patient regarding her pathology results she informed me that her insurance will not cover more than #90 pills a year, that is why we discussed patient purchasing OTC Omeprazole. I called [facility] pharmacy [phone]) and spoke with the pharmacist. She ran a test claim and Omeprazole would not be covered for another 365 days. No need to send RX.   Lm on vm for patient to return call to discuss.

## 2022-03-02 ENCOUNTER — Other Ambulatory Visit (HOSPITAL_COMMUNITY): Payer: Self-pay

## 2022-03-02 NOTE — Telephone Encounter (Signed)
PA was submitted and approved as BID. This was filled at pharmacy level on 10.31.23.

## 2022-03-02 NOTE — Telephone Encounter (Signed)
Great, thank you!

## 2022-03-03 ENCOUNTER — Encounter: Payer: Self-pay | Admitting: Pulmonary Disease

## 2022-03-03 ENCOUNTER — Ambulatory Visit: Payer: 59 | Admitting: Pulmonary Disease

## 2022-03-03 VITALS — BP 146/80 | HR 73 | Temp 98.3°F | Ht 62.0 in | Wt 134.8 lb

## 2022-03-03 DIAGNOSIS — J4540 Moderate persistent asthma, uncomplicated: Secondary | ICD-10-CM

## 2022-03-03 MED ORDER — TRELEGY ELLIPTA 200-62.5-25 MCG/ACT IN AEPB: INHALATION_SPRAY | RESPIRATORY_TRACT | 11 refills | Status: DC

## 2022-03-03 NOTE — Progress Notes (Signed)
$'@Patient'c$  ID: Julie Hensley, female    DOB: 1960/07/28, 61 y.o.   MRN: [id number]  Chief Complaint  Patient presents with   Follow-up    Doing well.  No sx noted.  Trelegy working great.    Referring provider: Alcus Dad, MD  HPI:   60 y.o. woman whom we are seeing in follow-up for evaluation of dyspnea on exertion related to asthma.  Symptoms greatly improved with high-dose Trelegy.  PFTs 03/2021 normal without fixed obstruction, normal DLCO, normal lung volumes.  Overall patient doing quite well.  She states Trelegy high-dose has helped very much.  Rare if any albuterol use over the last year.  Breathing feels good.  No issues with bronchitis.  No exacerbations.  No prednisone use in the interim.    HPI initial visit: Patient with dyspnea on exertion.  Worse with inclines or stairs.  She is on various inhalers.  Told she has COPD based on what sounds like imaging findings in the past.  She currently uses Advair/Wixela as prescribed.  Does not think it helps very much.  No time of day where breathing better or worse.  She got the device seasonal environmental factors make things better or worse.  No other relieving or exacerbating factors.  In the she was seen in the emergency room 09/18/2020.  That note is reviewed.  She was diagnosed with a UTI and COPD exacerbation given her symptoms.  She was given Augmentin and prednisone.  Chest x-ray reviewed that encounter 08/2020 reveals clear lungs with mild hyperinflation on my interpretation.  Reviewed CT scan August/2016 the monitor data shows mild emphysema in the apices, bibasilar linear atelectasis.  Do not see any record of pulmonary function test performed.  She does not think she is ever had these done.  PMH: Tobacco abuse Surgical history: Appendectomy, tonsillectomy Family history: Mother with hypertension, CAD, COPD, father with hypertension, CVA Social history: Current smoker about 20 pack years, lives in  [facility] / Pulmonary Flowsheets:   ACT:  Asthma Control Test ACT Total Score  03/03/2022  1:13 PM 20   MMRC:     No data to display          Epworth:      No data to display          Tests:   FENO:  No results found for: "NITRICOXIDE"  PFT:    Latest Ref Rng & Units 03/04/2021    8:58 AM  PFT Results  FVC-Pre L 3.03   FVC-Predicted Pre % 95   FVC-Post L 3.12   FVC-Predicted Post % 98   Pre FEV1/FVC % % 79   Post FEV1/FCV % % 77   FEV1-Pre L 2.38   FEV1-Predicted Pre % 96   FEV1-Post L 2.42   DLCO uncorrected ml/min/mmHg 17.47   DLCO UNC% % 89   DLCO corrected ml/min/mmHg 17.47   DLCO COR %Predicted % 89   DLVA Predicted % 89   TLC L 4.49   TLC % Predicted % 91   RV % Predicted % 73   Personally reviewed and interpreted as normal spirometry, no bronchodilator spots, lung volumes within normal limits, DLCO within normal limits.  WALK:      No data to display          Imaging: Personally reviewed and as per EMR discussion of this note  Lab Results: Personally reviewed CBC    Component Value Date/Time   WBC 6.9 11/05/2021 1614  WBC 10.0 12/16/2014 1850   RBC 4.92 11/05/2021 1614   RBC 5.23 (H) 12/16/2014 1850   HGB 15.0 11/05/2021 1614   HCT 44.2 11/05/2021 1614   PLT 260 11/05/2021 1614   MCV 90 11/05/2021 1614   MCH 30.5 11/05/2021 1614   MCH 30.4 12/16/2014 1850   MCHC 33.9 11/05/2021 1614   MCHC 34.9 12/16/2014 1850   RDW 13.0 11/05/2021 1614   LYMPHSABS 1.5 11/05/2021 1614   MONOABS 0.4 06/10/2014 1015   EOSABS 0.2 11/05/2021 1614   BASOSABS 0.1 11/05/2021 1614    BMET    Component Value Date/Time   NA 144 01/14/2022 1424   K 4.0 01/14/2022 1424   CL 103 01/14/2022 1424   CO2 22 01/14/2022 1424   GLUCOSE 81 01/14/2022 1424   GLUCOSE 132 (H) 12/16/2014 1923   BUN 12 01/14/2022 1424   CREATININE 0.70 01/14/2022 1424   CREATININE 0.75 06/10/2014 1015   CALCIUM 9.8 01/14/2022 1424   GFRNONAA 37 (L)  12/16/2014 1850   GFRNONAA >89 06/10/2014 1015   GFRAA 43 (L) 12/16/2014 1850   GFRAA >89 06/10/2014 1015    BNP No results found for: "BNP"  ProBNP No results found for: "PROBNP"  Specialty Problems       Pulmonary Problems   Asthma    No Known Allergies  Immunization History  Administered Date(s) Administered   Moderna Sars-Covid-2 Vaccination 01/01/2020, 01/29/2020    Past Medical History:  Diagnosis Date   Acid reflux    Anxiety    Arthritis    Asthma 03/04/2021   Chronic headaches    Complication of anesthesia    COPD (chronic obstructive pulmonary disease) (HCC)    GERD (gastroesophageal reflux disease)    Hypertension    HBP at [place] but never been to doctor for it.    PONV (postoperative nausea and vomiting)     Tobacco History: Social History   Tobacco Use  Smoking Status Every Day   Packs/day: 0.50   Years: 40.00   Total pack years: 20.00   Types: Cigarettes  Smokeless Tobacco Never  Tobacco Comments   0.5 ppd 12/09/2020   Ready to quit: Not Answered Counseling given: Not Answered Tobacco comments: 0.5 ppd 12/09/2020   Continue to not smoke  Outpatient Encounter Medications as of 03/03/2022  Medication Sig   acetaminophen (TYLENOL) 500 MG tablet Take 1,000-1,500 mg by mouth daily as needed for headache.   albuterol (VENTOLIN HFA) 108 (90 Base) MCG/ACT inhaler INHALE 2 PUFFS INTO LUNGS UP TO EVERY 4 HOURS AS NEEDED   amLODipine (NORVASC) 5 MG tablet TAKE 1 TABLET BY MOUTH EVERYDAY AT BEDTIME   dicyclomine (BENTYL) 10 MG capsule TAKE 1 CAPSULE (10 MG TOTAL) BY MOUTH EVERY 8 (EIGHT) HOURS AS NEEDED FOR SPASMS.   Docusate Sodium (EQ STOOL SOFTENER PO) Take 1 tablet by mouth daily.   losartan (COZAAR) 50 MG tablet TAKE 1 TABLET BY MOUTH EVERY DAY   omeprazole (PRILOSEC) 40 MG capsule Take 1 capsule (40 mg total) by mouth 2 (two) times daily before a meal.   ondansetron (ZOFRAN-ODT) 4 MG disintegrating tablet TAKE 1 TABLET BY MOUTH  EVERY 8 HOURS AS NEEDED FOR NAUSEA AND VOMITING   polyethylene glycol powder (GLYCOLAX/MIRALAX) 17 GM/SCOOP powder Take 17 g by mouth daily.   rosuvastatin (CRESTOR) 20 MG tablet Take 1 tablet (20 mg total) by mouth daily.   senna (SENOKOT) 8.6 MG TABS tablet Take 1 tablet (8.6 mg total) by mouth daily.  traZODone (DESYREL) 100 MG tablet TAKE 1 TABLET BY MOUTH AT BEDTIME AS NEEDED FOR SLEEP.   [DISCONTINUED] TRELEGY ELLIPTA 200-62.5-25 MCG/ACT AEPB TAKE 1 PUFF BY MOUTH EVERY DAY   dicyclomine (BENTYL) 20 MG tablet Take 20 mg by mouth 2 (two) times daily. (Patient not taking: Reported on 03/03/2022)   Fluticasone-Umeclidin-Vilant (TRELEGY ELLIPTA) 200-62.5-25 MCG/ACT AEPB TAKE 1 PUFF BY MOUTH EVERY DAY   metoprolol tartrate (LOPRESSOR) 100 MG tablet Take one tablet by mouth 2 hours prior to CT Scan   No facility-administered encounter medications on file as of 03/03/2022.     Review of Systems  Review of Systems  N/a  Physical Exam  BP (!) 146/80 (BP Location: Right Arm, Patient Position: Sitting, Cuff Size: Normal)   Pulse 73   Temp 98.3 F (36.8 C) (Oral)   Ht '5\' 2"'$  (1.575 m)   Wt 134 lb 12.8 oz (61.1 kg)   SpO2 98%   BMI 24.66 kg/m   Wt Readings from Last 5 Encounters:  03/03/22 134 lb 12.8 oz (61.1 kg)  02/17/22 135 lb (61.2 kg)  01/14/22 138 lb 12.8 oz (63 kg)  12/13/21 135 lb (61.2 kg)  12/03/21 137 lb 9.6 oz (62.4 kg)    BMI Readings from Last 5 Encounters:  03/03/22 24.66 kg/m  02/17/22 24.89 kg/m  01/14/22 25.59 kg/m  12/13/21 24.89 kg/m  12/03/21 24.37 kg/m     Physical Exam General: Well-appearing, sitting in chair Eyes: EOMI, icterus Neck: Supple no JVP Cardiovascular regular rhythm, no murmurs Pulmonary: Distant, normal work of breathing, no wheeze or crackle Abdomen: Nondistended, bowel sounds present MSK: No synovitis, joint effusion Neuro: Normal gait, no weakness Psych: Normal mood, full affect   Assessment & Plan:   Dyspnea on  exertion due to asthma: PFTs normal without fixed obstruction.  No gas trapping, DLCO within normal limits.  Environmental triggers, atopic symptoms.  Improved to resolved with high-dose Trelegy.  Related to asthma.   Lung cancer screening: Lung RADS 2 11/2020, due for repeat 11/2021, yet yet done due to ongoing cardiac and GI work-up but this is ordered.  Emphysema: Related to cigarette smoking.   Return in about 1 year (around 03/04/2023).   Lanier Clam, MD 03/03/2022

## 2022-03-03 NOTE — Patient Instructions (Signed)
No changes to medications  I refilled the Trelegy  Return to clinic in 1 year or sooner as needed

## 2022-03-09 ENCOUNTER — Encounter: Payer: Self-pay | Admitting: Gastroenterology

## 2022-03-22 ENCOUNTER — Other Ambulatory Visit: Payer: Self-pay | Admitting: Family Medicine

## 2022-03-22 DIAGNOSIS — F99 Mental disorder, not otherwise specified: Secondary | ICD-10-CM

## 2022-03-22 DIAGNOSIS — F411 Generalized anxiety disorder: Secondary | ICD-10-CM

## 2022-04-01 ENCOUNTER — Other Ambulatory Visit: Payer: Self-pay | Admitting: Family Medicine

## 2022-04-01 ENCOUNTER — Ambulatory Visit
Admission: EM | Admit: 2022-04-01 | Discharge: 2022-04-01 | Disposition: A | Discharge status: Home / Self Care | Payer: 59 | Attending: Family Medicine | Admitting: Family Medicine

## 2022-04-01 ENCOUNTER — Encounter: Payer: Self-pay | Admitting: Emergency Medicine

## 2022-04-01 DIAGNOSIS — K130 Diseases of lips: Secondary | ICD-10-CM

## 2022-04-01 DIAGNOSIS — J22 Unspecified acute lower respiratory infection: Secondary | ICD-10-CM | POA: Diagnosis not present

## 2022-04-01 MED ORDER — PREDNISONE 20 MG PO TABS: 40.0000 mg | ORAL_TABLET | Freq: Every day | ORAL | 0 refills | Status: DC

## 2022-04-01 MED ORDER — AZITHROMYCIN 250 MG PO TABS: ORAL_TABLET | ORAL | 0 refills | Status: DC

## 2022-04-01 MED ORDER — PROMETHAZINE-DM 6.25-15 MG/5ML PO SYRP: 5.0000 mL | ORAL_SOLUTION | Freq: Four times a day (QID) | ORAL | 0 refills | Status: DC | PRN

## 2022-04-01 MED ORDER — GUAIFENESIN ER 600 MG PO TB12: 600.0000 mg | ORAL_TABLET | Freq: Two times a day (BID) | ORAL | 0 refills | Status: DC | PRN

## 2022-04-01 NOTE — Discharge Instructions (Signed)
Apply Aquaphor on your lips throughout the day to keep them hydrated and protected

## 2022-04-01 NOTE — ED Triage Notes (Signed)
Nasal congestion with green congestion since Sunday.  C/o head pressure.

## 2022-04-05 NOTE — ED Provider Notes (Signed)
[facility]    CSN: [id number] Arrival date & time: 04/01/22  1346      History   Chief Complaint No chief complaint on file.   HPI Julie Hensley is a 61 y.o. female.   Presenting today with 1 week history of nasal congestion, facial pressure, productive cough. Denies fever, chills, CP, SOB, abdominal pain, N/V/D. So far trying OTC cold and congestion medications with minimal relief. Hx of asthma on prn albuterol. Also c/o chapped lips despite trying multiple medications topically OTC.    Past Medical History:  Diagnosis Date   Acid reflux    Anxiety    Arthritis    Asthma 03/04/2021   Chronic headaches    Complication of anesthesia    COPD (chronic obstructive pulmonary disease) (HCC)    GERD (gastroesophageal reflux disease)    Hypertension    HBP at [employer] machine but never been to doctor for it.    PONV (postoperative nausea and vomiting)     Patient Active Problem List   Diagnosis Date Noted   Abdominal pain 12/05/2021   Asthma 03/04/2021   Cognitive impairment 07/02/2020   Suspected chronic obstructive pulmonary disease based on initial evaluation ([place]) 07/01/2019   Unintentional weight loss 07/01/2014   Insomnia 06/10/2014   Essential hypertension 06/10/2014   Tobacco abuse 06/10/2014   Generalized anxiety disorder 06/10/2014   Esophageal reflux 06/10/2014    Past Surgical History:  Procedure Laterality Date   APPENDECTOMY     EXPLORATORY LAPAROTOMY     MANDIBLE FRACTURE SURGERY     TONSILLECTOMY      OB History     Gravida  1   Para  1   Term  1   Preterm  0   AB  0   Living  1      SAB  0   IAB  0   Ectopic  0   Multiple  0   Live Births  1            Home Medications    Prior to Admission medications   Medication Sig Start Date End Date Taking? Authorizing Provider  azithromycin (ZITHROMAX) 250 MG tablet Take first 2 tablets together, then 1 every day until finished. 04/01/22  Yes Volney American, PA-C  guaiFENesin (MUCINEX) 600 MG 12 hr tablet Take 1 tablet (600 mg total) by mouth 2 (two) times daily as needed. 04/01/22  Yes Volney American, PA-C  predniSONE (DELTASONE) 20 MG tablet Take 2 tablets (40 mg total) by mouth daily with breakfast. 04/01/22  Yes Volney American, PA-C  promethazine-dextromethorphan (PROMETHAZINE-DM) 6.25-15 MG/5ML syrup Take 5 mLs by mouth 4 (four) times daily as needed. 04/01/22  Yes Volney American, PA-C  acetaminophen (TYLENOL) 500 MG tablet Take 1,000-1,500 mg by mouth daily as needed for headache.    [provider]  albuterol (VENTOLIN HFA) 108 (90 Base) MCG/ACT inhaler INHALE 2 PUFFS INTO LUNGS UP TO EVERY 4 HOURS AS NEEDED 01/28/22   Alcus Dad, MD  amLODipine (NORVASC) 5 MG tablet TAKE 1 TABLET BY MOUTH EVERYDAY AT BEDTIME 03/01/22   Alcus Dad, MD  dicyclomine (BENTYL) 10 MG capsule TAKE 1 CAPSULE (10 MG TOTAL) BY MOUTH EVERY 8 (EIGHT) HOURS AS NEEDED FOR SPASMS. 02/06/22   Armbruster, Carlota Raspberry, MD  Docusate Sodium (EQ STOOL SOFTENER PO) Take 1 tablet by mouth daily.    [provider]  Fluticasone-Umeclidin-Vilant (TRELEGY ELLIPTA) 200-62.5-25 MCG/ACT AEPB TAKE 1 PUFF BY MOUTH  EVERY DAY 03/03/22   Hunsucker, Bonna Gains, MD  losartan (COZAAR) 50 MG tablet TAKE 1 TABLET BY MOUTH EVERY DAY 03/01/22   Alcus Dad, MD  omeprazole (PRILOSEC) 40 MG capsule TAKE 1 CAPSULE (40 MG TOTAL) BY MOUTH DAILY. 04/01/22   Alcus Dad, MD  ondansetron (ZOFRAN-ODT) 4 MG disintegrating tablet TAKE 1 TABLET BY MOUTH EVERY 8 HOURS AS NEEDED FOR NAUSEA AND VOMITING 03/01/22   Alcus Dad, MD  polyethylene glycol powder (GLYCOLAX/MIRALAX) 17 GM/SCOOP powder Take 17 g by mouth daily. 12/04/21   Alcus Dad, MD  rosuvastatin (CRESTOR) 20 MG tablet Take 1 tablet (20 mg total) by mouth daily. 01/14/22   Jettie Booze, MD  senna (SENOKOT) 8.6 MG TABS tablet Take 1 tablet (8.6 mg total) by mouth daily. 12/04/21    Alcus Dad, MD  traZODone (DESYREL) 100 MG tablet TAKE 1 TABLET BY MOUTH EVERY DAY AT BEDTIME AS NEEDED FOR SLEEP 03/22/22   Alcus Dad, MD    Family History Family History  Problem Relation Age of Onset   Hypertension Mother    Heart disease Mother    COPD Mother    Thyroid disease Mother    Hernia Mother    Heart disease Father    Hypertension Father    Stroke Father    Hypertension Sister    Hyperlipidemia Sister    Hypertension Brother    Hernia Brother    Hypertension Brother    Hypertension Brother    Hypertension Brother    Hernia Brother    Heart disease Maternal Grandmother    Heart disease Maternal Grandfather    Diabetes Paternal Grandmother    Heart disease Paternal Grandmother    Heart disease Paternal Grandfather    Anesthesia problems Neg Hx    Colon cancer Neg Hx    Rectal cancer Neg Hx    Stomach cancer Neg Hx     Social History Social History   Tobacco Use   Smoking status: Every Day    Packs/day: 0.50    Years: 40.00    Total pack years: 20.00    Types: Cigarettes   Smokeless tobacco: Never   Tobacco comments:    0.5 ppd 12/09/2020  Vaping Use   Vaping Use: Never used  Substance Use Topics   Alcohol use: No    Alcohol/week: 0.0 standard drinks of alcohol    Comment: has not had alcohol in a month.  Previously a case of beer daily   Drug use: No     Allergies   Patient has no known allergies.   Review of Systems Review of Systems PER HPI  Physical Exam Triage Vital Signs ED Triage Vitals [04/01/22 1442]  Enc Vitals Group     BP 130/84     Pulse Rate 63     Resp 18     Temp 98.6 F (37 C)     Temp Source Oral     SpO2 91 %     Weight      Height      Head Circumference      Peak Flow      Pain Score 10     Pain Loc      Pain Edu?      Excl. in [place]?    No data found.  Updated Vital Signs BP 130/84 (BP Location: Right Arm)   Pulse 63   Temp 98.6 F (37 C) (Oral)   Resp 18   SpO2 91%   Visual  Acuity Right Eye Distance:   Left Eye Distance:   Bilateral Distance:    Right Eye Near:   Left Eye Near:    Bilateral Near:     Physical Exam Vitals and nursing note reviewed.  Constitutional:      Appearance: Normal appearance.  HENT:     Head: Atraumatic.     Right Ear: Tympanic membrane and external ear normal.     Left Ear: Tympanic membrane and external ear normal.     Nose: Congestion present.     Mouth/Throat:     Mouth: Mucous membranes are moist.     Pharynx: Posterior oropharyngeal erythema present.     Comments: Chapped lips diffusely Eyes:     Extraocular Movements: Extraocular movements intact.     Conjunctiva/sclera: Conjunctivae normal.  Cardiovascular:     Rate and Rhythm: Normal rate and regular rhythm.     Heart sounds: Normal heart sounds.  Pulmonary:     Effort: Pulmonary effort is normal.     Breath sounds: Wheezing present.  Musculoskeletal:        General: Normal range of motion.     Cervical back: Normal range of motion and neck supple.  Skin:    General: Skin is warm and dry.  Neurological:     Mental Status: She is alert and oriented to person, place, and time.  Psychiatric:        Mood and Affect: Mood normal.        Thought Content: Thought content normal.      UC Treatments / Results  Labs (all labs ordered are listed, but only abnormal results are displayed) Labs Reviewed - No data to display  EKG   Radiology No results found.  Procedures Procedures (including critical care time)  Medications Ordered in UC Medications - No data to display  Initial Impression / Assessment and Plan / UC Course  I have reviewed the triage vital signs and the nursing notes.  Pertinent labs & imaging results that were available during my care of the patient were reviewed by me and considered in my medical decision making (see chart for details).     Treat for asthma exacerbation secondary to URI with prednisone, zithromax, mucinex, and  other supportive remedies. Albuterol prn to continue, patient states she has some at home. Regarding lips, aquaphor prn.  Final Clinical Impressions(s) / UC Diagnoses   Final diagnoses:  Lower respiratory infection  Chapped lips     Discharge Instructions      Apply Aquaphor on your lips throughout the day to keep them hydrated and protected    ED Prescriptions     Medication Sig Dispense Auth. Provider   predniSONE (DELTASONE) 20 MG tablet Take 2 tablets (40 mg total) by mouth daily with breakfast. 10 tablet Volney American, PA-C   azithromycin (ZITHROMAX) 250 MG tablet Take first 2 tablets together, then 1 every day until finished. 6 tablet Volney American, [place]   promethazine-dextromethorphan (PROMETHAZINE-DM) 6.25-15 MG/5ML syrup Take 5 mLs by mouth 4 (four) times daily as needed. 100 mL Volney American, PA-C   guaiFENesin (MUCINEX) 600 MG 12 hr tablet Take 1 tablet (600 mg total) by mouth 2 (two) times daily as needed. 20 tablet Volney American, [place]      PDMP not reviewed this encounter.   Volney American, [place] 04/05/22 2257

## 2022-04-14 ENCOUNTER — Other Ambulatory Visit: Payer: 59

## 2022-04-18 ENCOUNTER — Ambulatory Visit: Payer: 59 | Attending: Interventional Cardiology

## 2022-04-18 DIAGNOSIS — I2510 Atherosclerotic heart disease of native coronary artery without angina pectoris: Secondary | ICD-10-CM | POA: Diagnosis not present

## 2022-04-18 DIAGNOSIS — I2584 Coronary atherosclerosis due to calcified coronary lesion: Secondary | ICD-10-CM | POA: Diagnosis not present

## 2022-04-18 DIAGNOSIS — I700 Atherosclerosis of aorta: Secondary | ICD-10-CM | POA: Diagnosis not present

## 2022-04-18 LAB — LIPID PANEL
Chol/HDL Ratio: 3 ratio (ref 0.0–4.4)
Cholesterol, Total: 143 mg/dL (ref 100–199)
HDL: 48 mg/dL (ref >39)
LDL Chol Calc (NIH): 77 mg/dL (ref 0–99)
Triglycerides: 94 mg/dL (ref 0–149)
VLDL Cholesterol Cal: 18 mg/dL (ref 5–40)

## 2022-04-18 LAB — HEPATIC FUNCTION PANEL
ALT: 6 IU/L (ref 0–32)
AST: 9 IU/L (ref 0–40)
Albumin: 4 g/dL (ref 3.9–4.9)
Alkaline Phosphatase: 83 IU/L (ref 44–121)
Bilirubin Total: 0.3 mg/dL (ref 0.0–1.2)
Bilirubin, Direct: 0.13 mg/dL (ref 0.00–0.40)
Total Protein: 6 g/dL (ref 6.0–8.5)

## 2022-04-19 ENCOUNTER — Other Ambulatory Visit: Payer: 59

## 2022-04-24 ENCOUNTER — Other Ambulatory Visit: Payer: Self-pay | Admitting: Family Medicine

## 2022-04-24 DIAGNOSIS — I10 Essential (primary) hypertension: Secondary | ICD-10-CM

## 2022-04-27 ENCOUNTER — Other Ambulatory Visit: Payer: Self-pay | Admitting: Family Medicine

## 2022-04-27 DIAGNOSIS — J449 Chronic obstructive pulmonary disease, unspecified: Secondary | ICD-10-CM

## 2022-05-28 ENCOUNTER — Other Ambulatory Visit: Payer: Self-pay | Admitting: Gastroenterology

## 2022-06-23 ENCOUNTER — Other Ambulatory Visit: Payer: Self-pay | Admitting: Family Medicine

## 2022-07-12 ENCOUNTER — Other Ambulatory Visit: Payer: Self-pay

## 2022-07-12 DIAGNOSIS — F5105 Insomnia due to other mental disorder: Secondary | ICD-10-CM

## 2022-07-12 DIAGNOSIS — I10 Essential (primary) hypertension: Secondary | ICD-10-CM

## 2022-07-12 DIAGNOSIS — F411 Generalized anxiety disorder: Secondary | ICD-10-CM

## 2022-07-12 DIAGNOSIS — J449 Chronic obstructive pulmonary disease, unspecified: Secondary | ICD-10-CM

## 2022-07-12 MED ORDER — TRAZODONE HCL 100 MG PO TABS: ORAL_TABLET | ORAL | 0 refills | Status: DC

## 2022-07-12 MED ORDER — ALBUTEROL SULFATE HFA 108 (90 BASE) MCG/ACT IN AERS: INHALATION_SPRAY | RESPIRATORY_TRACT | 2 refills | Status: DC

## 2022-07-12 MED ORDER — LOSARTAN POTASSIUM 50 MG PO TABS: 50.0000 mg | ORAL_TABLET | Freq: Every day | ORAL | 0 refills | Status: DC

## 2022-07-12 NOTE — Telephone Encounter (Signed)
Patient calls nurse line requesting medication refills. Patient states that she is unable to schedule appointment at this time due to recent deaths in the family and not being able to afford appointment at this time.   Advised that I would send refill requests to PCP. Patient will call back when she is able to schedule appointment.   Talbot Grumbling, RN

## 2022-09-05 ENCOUNTER — Other Ambulatory Visit: Payer: Self-pay | Admitting: Family Medicine

## 2022-09-11 ENCOUNTER — Other Ambulatory Visit: Payer: Self-pay | Admitting: Gastroenterology

## 2022-09-21 ENCOUNTER — Other Ambulatory Visit (HOSPITAL_COMMUNITY): Payer: Self-pay

## 2022-09-21 MED ORDER — MOUNJARO 5 MG/0.5ML ~~LOC~~ SOAJ
5.0000 mg | SUBCUTANEOUS | 12 refills | Status: DC
  Filled 2022-09-21: qty 2, 28d supply, fill #0

## 2022-10-12 ENCOUNTER — Other Ambulatory Visit: Payer: Self-pay | Admitting: Family Medicine

## 2022-10-12 DIAGNOSIS — J449 Chronic obstructive pulmonary disease, unspecified: Secondary | ICD-10-CM

## 2022-10-13 ENCOUNTER — Other Ambulatory Visit: Payer: Self-pay | Admitting: Family Medicine

## 2022-10-13 DIAGNOSIS — I10 Essential (primary) hypertension: Secondary | ICD-10-CM

## 2022-10-31 DIAGNOSIS — Z419 Encounter for procedure for purposes other than remedying health state, unspecified: Secondary | ICD-10-CM | POA: Diagnosis not present

## 2022-11-01 ENCOUNTER — Encounter: Payer: Self-pay | Admitting: Pulmonary Disease

## 2022-12-01 DIAGNOSIS — Z419 Encounter for procedure for purposes other than remedying health state, unspecified: Secondary | ICD-10-CM | POA: Diagnosis not present

## 2022-12-06 ENCOUNTER — Ambulatory Visit: Payer: 59 | Admitting: Family Medicine

## 2022-12-12 ENCOUNTER — Telehealth: Payer: Self-pay

## 2022-12-12 ENCOUNTER — Ambulatory Visit (INDEPENDENT_AMBULATORY_CARE_PROVIDER_SITE_OTHER): Payer: Medicaid Other | Admitting: Family Medicine

## 2022-12-12 VITALS — BP 140/80

## 2022-12-12 DIAGNOSIS — R5383 Other fatigue: Secondary | ICD-10-CM | POA: Diagnosis not present

## 2022-12-12 DIAGNOSIS — F418 Other specified anxiety disorders: Secondary | ICD-10-CM | POA: Insufficient documentation

## 2022-12-12 DIAGNOSIS — I10 Essential (primary) hypertension: Secondary | ICD-10-CM

## 2022-12-12 DIAGNOSIS — R4589 Other symptoms and signs involving emotional state: Secondary | ICD-10-CM

## 2022-12-12 DIAGNOSIS — J449 Chronic obstructive pulmonary disease, unspecified: Secondary | ICD-10-CM

## 2022-12-12 MED ORDER — ALBUTEROL SULFATE HFA 108 (90 BASE) MCG/ACT IN AERS: INHALATION_SPRAY | RESPIRATORY_TRACT | 2 refills | Status: DC

## 2022-12-12 MED ORDER — LOSARTAN POTASSIUM 50 MG PO TABS: 50.0000 mg | ORAL_TABLET | Freq: Every day | ORAL | 0 refills | Status: DC

## 2022-12-12 MED ORDER — QUETIAPINE FUMARATE 25 MG PO TABS: 25.0000 mg | ORAL_TABLET | Freq: Two times a day (BID) | ORAL | 1 refills | Status: DC

## 2022-12-12 MED ORDER — AMLODIPINE BESYLATE 5 MG PO TABS: 5.0000 mg | ORAL_TABLET | Freq: Every day | ORAL | 1 refills | Status: DC

## 2022-12-12 MED ORDER — LOSARTAN POTASSIUM 50 MG PO TABS
50.0000 mg | ORAL_TABLET | Freq: Every day | ORAL | 0 refills | Status: DC
Start: 2022-12-12 — End: 2023-01-24

## 2022-12-12 MED ORDER — ALBUTEROL SULFATE HFA 108 (90 BASE) MCG/ACT IN AERS
INHALATION_SPRAY | RESPIRATORY_TRACT | 2 refills | Status: DC
Start: 2022-12-12 — End: 2022-12-12

## 2022-12-12 NOTE — Assessment & Plan Note (Addendum)
-   BP within target range today - Refilled amlodipine and losartan - BMP today
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
## 2022-12-12 NOTE — Patient Instructions (Addendum)
Thank you for visiting clinic today - it is always great to see you!  Today we discussed how you have been feeling lately. We would like to start a medication called Seroquel (quetiapine). Additionally, we have placed a referral to psychiatry. They should contact you for further scheduling. You may also refer to the list below for other psychiatry resources that you can call to schedule yourself. We have also placed a referral for social services, who will contact you regarding further transportation support. Otherwise, we will check a couple labs today to check in on your kidney health and fatigue.   Please schedule a clinic appointment in 1 month to follow up further.   Reach out to our clinic with any questions or concerns you may have - we are here for you.  Ivery Quale, MD   Psychiatry Resource List (Adults and Children) Most of these providers will take Medicaid. please consult your insurance for a complete and updated list of available providers. When calling to make an appointment have your insurance information available to confirm you are covered.   [facility]:Psychiatry and Counseling [address] [place], [place] [address] [phone]  [facility]  [address] [place], [place] [phone] Crisis [phone]   [facility]:   [facility]: [address]     [phone]   [facility]: [address] [place]. [place],        [phone] [place]: [address] Suite [phone],    [phone] [place]: [phone] [address],                   [phone] Children: [facility] [address]         [phone]  [facility] (virtual only) [phone]   [facility]  (Psychiatry only; Adults /children 12 and over, will take Medicaid)  [address] Laurell Josephs [address], [place], [place] [address]       [phone]   [facility] (Psychiatry &  counseling ; adults & children ; will take Medicaid [address]  [address]  [place] [address]  Go on-line to complete referral ( [email or web address] [phone]    (Spanish speaking therapists)  [facility]  Psychiatry & counseling; Adults and children;  Call Registration prior to scheduling an appointment [phone] [address]    [place], [place] [address]    [phone]  [facility] (Psychiatry & counseling; adults & children; Medicare no Medicaid)  [address]   [place], [place]  [address]      [phone]    [facility] (up to age 3)  Psychiatry & counseling ,will take Medicaid, must do counseling to receive psychiatry services  [address] [place] [address]        [phone]  [facility] (Psychiatry & counseling; adults & children; will take Medicaid) Will need a referral from provider [address],  [place], [place]  [phone]   [facility] --- Walk-In Mon-Friday 8am-3pm ( will take Medicaid, Psychiatry, Adults & children,  [address] [place], [place]   [phone]   [facility], Walk-in M-F 8am-12pm and 1pm -3pm   (Counseling, Psychiatry, will take Medicaid, adults & children)  [address], [place], [place]  [phone]

## 2022-12-12 NOTE — Assessment & Plan Note (Addendum)
-   PHQ-9 score of 16 today. No active SI/HI.  - MDQ score of 7 today. Positive for bipolar screening. - Restart seroquel 25 BID - CBC today for fatigue - Psychiatry referral placed - SW referral placed for SDOH

## 2022-12-12 NOTE — Telephone Encounter (Signed)
Patient calls nurse line in regards to prescriptions sent in at today's OV.   She reports the medications went to the wrong pharmacy. She [facility] [address]  She asks we resend these to [facility] on [address].  [facility].   Prescriptions resent to [facility].   I have updated her preferred pharmacy.

## 2022-12-16 ENCOUNTER — Telehealth: Payer: Self-pay

## 2022-12-16 NOTE — Telephone Encounter (Signed)
Patient calls nurse line regarding Seroquel prescription. She states that she had tried medication in the past and had side effects but could not remember what they were when she discussed this with provider.   She tried medication and reports that it causes her to be shaky and to have bad nightmares. She wanted me to let the provider know that she will not be taking anymore of this medication.   Please return call to patient with further questions at [phone].  Veronda Prude, RN

## 2022-12-17 ENCOUNTER — Encounter: Payer: Self-pay | Admitting: Family Medicine

## 2022-12-19 ENCOUNTER — Other Ambulatory Visit: Payer: Medicaid Other

## 2022-12-19 NOTE — Patient Instructions (Signed)
Visit Information  Ms. Pfiester was given information about Medicaid Managed Care team care coordination services as a part of their [facility] Medicaid benefit. Graceann Lollie verbally consented to engagement with the [facility] Managed Care team.   If you are experiencing a medical emergency, please call 911 or report to your local emergency department or urgent care.   If you have a non-emergency medical problem during routine business hours, please contact your provider's office and ask to speak with a nurse.   For questions related to your [facility] health plan, please call: [phone] or go here:[email or web address]  If you would like to schedule transportation through your [facility] plan, please call the following number at least 2 days in advance of your appointment: [phone].   You can also use the MTM portal or MTM mobile app to manage your rides. Reimbursement for transportation is available through [facility]! For the portal, please go to [email or web address]  Call the [facility] Crisis Line at [phone], at any time, 24 hours a day, 7 days a week. If you are in danger or need immediate medical attention call 911.  If you would like help to quit smoking, call 1-800-QUIT-NOW (514-527-2398) OR Espaol: 1-855-Djelo-Ya (4-132-440-1027) o para ms informacin haga clic aqu or Text READY to 253-664 to register via text  Ms. Sindoni - following are the goals we discussed in your visit today:   Goals Addressed   None       Social Worker will follow up in 30 days.   Gus Puma, Kenard Gower, MHA [facility]  Managed Medicaid Social Worker [phone]   Following is a copy of your plan of care:  There are no care plans that you recently modified to display for this patient.

## 2022-12-19 NOTE — Patient Outreach (Signed)
  Medicaid Managed Care Social Work Note  12/19/2022 Name:  Anapaula Nordland MRN:  [id number] DOB:  May 12, 1960  Lametra Depalma is an 62 y.o. year old female who is a primary patient of Ivery Quale, MD.  The Medicaid Managed Care Coordination team was consulted for assistance with:  Community Resources   Ms. Fred was given information about Medicaid Managed Care Coordination team services today. Florene Route Patient agreed to services and verbal consent obtained.  Engaged with patient  for by telephone forinitial visit in response to referral for case management and/or care coordination services.   Assessments/Interventions:  Review of past medical history, allergies, medications, health status, including review of consultants reports, laboratory and other test data, was performed as part of comprehensive evaluation and provision of chronic care management services.  SDOH: (Social Determinant of Health) assessments and interventions performed: SDOH Interventions    Flowsheet Row Office Visit from 11/05/2021 in [facility] Chronic Care Management from 12/25/2019 in [facility]  SDOH Interventions    Depression Interventions/Treatment  Currently on Treatment Referral to Psychiatry, Counseling  Stress Interventions -- Provide Counseling     BSW completed a telephone outreach with patient, she states she just had to move in with her mother. The person she was living with past away. Patient is only getting social security of 720 each month. Patient states she is looking for somewhere to move that is income based. Patient states she is new to everything with needing resources. BSW send patient resources for affordable apartments, applying for foodstamps, and insurance benefits.   Advanced Directives Status:  Not addressed in this encounter.  Care Plan                 No Known Allergies  Medications Reviewed Today   Medications were not reviewed in this  encounter     Patient Active Problem List   Diagnosis Date Noted   Depression with anxiety 12/12/2022   Abdominal pain 12/05/2021   Asthma 03/04/2021   Cognitive impairment 07/02/2020   Suspected chronic obstructive pulmonary disease based on initial evaluation (HCC) 07/01/2019   Unintentional weight loss 07/01/2014   Insomnia 06/10/2014   Essential hypertension 06/10/2014   Tobacco abuse 06/10/2014   Generalized anxiety disorder 06/10/2014   Esophageal reflux 06/10/2014    Conditions to be addressed/monitored per PCP order:   community resources  There are no care plans that you recently modified to display for this patient.   Follow up:  Patient agrees to Care Plan and Follow-up.  Plan: The Managed Medicaid care management team will reach out to the patient again over the next 30 days.  Date/time of next scheduled Social Work care management/care coordination outreach:  01/19/23  Gus Puma, Kenard Gower, [facility] Health  Managed [facility] Social Worker [phone]

## 2022-12-26 ENCOUNTER — Telehealth: Payer: Self-pay | Admitting: Family Medicine

## 2022-12-26 NOTE — Telephone Encounter (Signed)
Called and spoke with patient about recent appointment cancellation. Discussed making another appointment with the clinic team soon to further discuss mood and medication management.

## 2022-12-27 ENCOUNTER — Telehealth: Payer: Medicaid Other | Admitting: Student

## 2022-12-27 ENCOUNTER — Telehealth: Payer: Medicaid Other | Admitting: Family Medicine

## 2022-12-27 ENCOUNTER — Other Ambulatory Visit: Payer: Self-pay | Admitting: Interventional Cardiology

## 2023-01-01 DIAGNOSIS — Z419 Encounter for procedure for purposes other than remedying health state, unspecified: Secondary | ICD-10-CM | POA: Diagnosis not present

## 2023-01-19 ENCOUNTER — Other Ambulatory Visit: Payer: Medicaid Other

## 2023-01-19 NOTE — Patient Outreach (Signed)
Medicaid Managed Care   Unsuccessful Outreach Note  01/19/2023 Name: Julie Hensley MRN: [id number] DOB: [birth year]  Referred by: Ivery Quale, MD Reason for referral : High Risk Managed Medicaid (MM Social work unsuccessful telephone outreach )   An unsuccessful telephone outreach was attempted today. The patient was referred to the case management team for assistance with care management and care coordination.   Follow Up Plan: A HIPAA compliant phone message was left for the patient providing contact information and requesting a return call.   Abelino Derrick, MHA [facility]  Managed [facility] Social Worker [phone]

## 2023-01-19 NOTE — Patient Instructions (Signed)
Medicaid Managed Care   Unsuccessful Outreach Note  01/19/2023 Name: Julie Hensley MRN: [id number] DOB: [birth year]  Referred by: Ivery Quale, MD Reason for referral : High Risk Managed Medicaid (MM Social work unsuccessful telephone outreach )   An unsuccessful telephone outreach was attempted today. The patient was referred to the case management team for assistance with care management and care coordination.   Follow Up Plan: A HIPAA compliant phone message was left for the patient providing contact information and requesting a return call.   Abelino Derrick, MHA [facility]  Managed [facility] Social Worker [phone]

## 2023-01-20 ENCOUNTER — Ambulatory Visit: Payer: Medicaid Other | Admitting: Family Medicine

## 2023-01-23 ENCOUNTER — Other Ambulatory Visit: Payer: Self-pay | Admitting: Family Medicine

## 2023-01-23 DIAGNOSIS — I10 Essential (primary) hypertension: Secondary | ICD-10-CM

## 2023-01-25 ENCOUNTER — Other Ambulatory Visit: Payer: Self-pay | Admitting: Family Medicine

## 2023-01-25 DIAGNOSIS — F5105 Insomnia due to other mental disorder: Secondary | ICD-10-CM

## 2023-01-25 DIAGNOSIS — F411 Generalized anxiety disorder: Secondary | ICD-10-CM

## 2023-01-25 DIAGNOSIS — I10 Essential (primary) hypertension: Secondary | ICD-10-CM

## 2023-01-26 MED ORDER — TRAZODONE HCL 100 MG PO TABS
ORAL_TABLET | ORAL | 0 refills | Status: DC
Start: 2023-01-26 — End: 2023-03-17

## 2023-01-26 NOTE — Telephone Encounter (Signed)
Lvm for patient to call back and schedule appointment.   Thanks [employer]

## 2023-01-26 NOTE — Telephone Encounter (Signed)
Patient LVM on nurse line regarding need for appointment.   She states that she was in the office on 12/12/22 to get medication refills. She states that she does not understand why she would need an additional appointment.   Please advise.   Veronda Prude, RN

## 2023-01-31 DIAGNOSIS — Z419 Encounter for procedure for purposes other than remedying health state, unspecified: Secondary | ICD-10-CM | POA: Diagnosis not present

## 2023-02-20 ENCOUNTER — Other Ambulatory Visit: Payer: Self-pay | Admitting: Family Medicine

## 2023-02-20 DIAGNOSIS — I10 Essential (primary) hypertension: Secondary | ICD-10-CM

## 2023-03-03 ENCOUNTER — Telehealth: Payer: Self-pay | Admitting: Pulmonary Disease

## 2023-03-03 DIAGNOSIS — Z419 Encounter for procedure for purposes other than remedying health state, unspecified: Secondary | ICD-10-CM | POA: Diagnosis not present

## 2023-03-04 MED ORDER — TRELEGY ELLIPTA 200-62.5-25 MCG/ACT IN AEPB: INHALATION_SPRAY | RESPIRATORY_TRACT | 11 refills | Status: DC

## 2023-03-04 NOTE — Telephone Encounter (Signed)
Trelegy inhaler refilled for pt and mychart sent to pt letting her know this was done.

## 2023-03-14 ENCOUNTER — Other Ambulatory Visit: Payer: Self-pay | Admitting: Interventional Cardiology

## 2023-03-15 ENCOUNTER — Encounter: Payer: Self-pay | Admitting: Gastroenterology

## 2023-03-17 ENCOUNTER — Other Ambulatory Visit: Payer: Self-pay | Admitting: Family Medicine

## 2023-03-17 DIAGNOSIS — F411 Generalized anxiety disorder: Secondary | ICD-10-CM

## 2023-03-17 DIAGNOSIS — F5105 Insomnia due to other mental disorder: Secondary | ICD-10-CM

## 2023-03-17 NOTE — Telephone Encounter (Signed)
Patient to be seen in clinic before next refill.

## 2023-03-27 NOTE — Telephone Encounter (Signed)
Called and scheduled patient  Thanks Nehemiah Settle

## 2023-03-28 ENCOUNTER — Other Ambulatory Visit: Payer: Self-pay

## 2023-03-28 DIAGNOSIS — J449 Chronic obstructive pulmonary disease, unspecified: Secondary | ICD-10-CM

## 2023-03-28 MED ORDER — ALBUTEROL SULFATE HFA 108 (90 BASE) MCG/ACT IN AERS: INHALATION_SPRAY | RESPIRATORY_TRACT | 2 refills | Status: DC

## 2023-04-02 DIAGNOSIS — Z419 Encounter for procedure for purposes other than remedying health state, unspecified: Secondary | ICD-10-CM | POA: Diagnosis not present

## 2023-04-17 ENCOUNTER — Ambulatory Visit: Payer: Medicaid Other | Admitting: Family Medicine

## 2023-04-17 ENCOUNTER — Encounter: Payer: Self-pay | Admitting: Family Medicine

## 2023-04-17 VITALS — BP 135/80 | HR 92 | Ht 62.0 in | Wt 129.8 lb

## 2023-04-17 DIAGNOSIS — R8290 Unspecified abnormal findings in urine: Secondary | ICD-10-CM

## 2023-04-17 DIAGNOSIS — Z0000 Encounter for general adult medical examination without abnormal findings: Secondary | ICD-10-CM | POA: Diagnosis not present

## 2023-04-17 DIAGNOSIS — F99 Mental disorder, not otherwise specified: Secondary | ICD-10-CM

## 2023-04-17 DIAGNOSIS — F5105 Insomnia due to other mental disorder: Secondary | ICD-10-CM | POA: Diagnosis not present

## 2023-04-17 DIAGNOSIS — I10 Essential (primary) hypertension: Secondary | ICD-10-CM

## 2023-04-17 DIAGNOSIS — Z860100 Personal history of colon polyps, unspecified: Secondary | ICD-10-CM | POA: Diagnosis not present

## 2023-04-17 DIAGNOSIS — F411 Generalized anxiety disorder: Secondary | ICD-10-CM

## 2023-04-17 DIAGNOSIS — R634 Abnormal weight loss: Secondary | ICD-10-CM

## 2023-04-17 DIAGNOSIS — G479 Sleep disorder, unspecified: Secondary | ICD-10-CM

## 2023-04-17 LAB — POCT URINALYSIS DIP (MANUAL ENTRY)
Bilirubin, UA: NEGATIVE
Glucose, UA: NEGATIVE mg/dL
Ketones, POC UA: NEGATIVE mg/dL
Leukocytes, UA: NEGATIVE
Nitrite, UA: NEGATIVE
Protein Ur, POC: NEGATIVE mg/dL
Spec Grav, UA: 1.02 (ref 1.010–1.025)
Urobilinogen, UA: 1 U/dL
pH, UA: 6.5 (ref 5.0–8.0)

## 2023-04-17 LAB — POCT UA - MICROSCOPIC ONLY: WBC, Ur, HPF, POC: NONE SEEN (ref 0–5)

## 2023-04-17 MED ORDER — FLUOXETINE HCL 20 MG PO TABS: 20.0000 mg | ORAL_TABLET | Freq: Every day | ORAL | 2 refills | Status: DC

## 2023-04-17 MED ORDER — TRAZODONE HCL 100 MG PO TABS: ORAL_TABLET | ORAL | 2 refills | Status: DC

## 2023-04-17 MED ORDER — LOSARTAN POTASSIUM 50 MG PO TABS: 50.0000 mg | ORAL_TABLET | Freq: Every day | ORAL | 2 refills | Status: DC

## 2023-04-17 MED ORDER — AMLODIPINE BESYLATE 5 MG PO TABS: 5.0000 mg | ORAL_TABLET | Freq: Every day | ORAL | 2 refills | Status: DC

## 2023-04-17 NOTE — Progress Notes (Signed)
SUBJECTIVE:   CHIEF COMPLAINT / HPI:   Depression/Anxiety  Patient presenting today for medication refills. She reports daily anxiety and difficulty sleeping. She continues to have significant life stressors including recent loss of a loved one and being the primary caregiver for her mother. No SI/HI. Currently taking only trazodone nightly. Has had a history of failed antidepressant treatments. Reports Prozac worked well for her in the past. Not interested in therapy at this time.   Insomnia Chronic difficulty falling and staying asleep. Reports around 4 hours of sleep per night. Some days she feels rested, other days she does not. Reports some difficulty breathing at times while asleep. No known history of sleep apnea, never had sleep study done before.   Cloudy urine Patient reports 1 week of cloudy urine with some malodor. No pain or burning with urination. No blood in urine. Reports some suprapubic pressure and some increased urgency at times. Has not been sexually active in recent years.   Unintentional weight loss Patient with history of unintentional weight loss. No recent fever or night sweats. Previously completed colonoscopy and mammogram last year, with recommendation to repeat this year. Patient is not interested in any further imaging at this time, sharing that she wants to leave her fate up to her religion.   PERTINENT  PMH / PSH: Depression/Anxiety, Insomnia, Unintentional weight loss  OBJECTIVE:   BP 135/80   Pulse 92   Ht 5\' 2"  (1.575 m)   Wt 129 lb 12.8 oz (58.9 kg)   SpO2 97%   BMI 23.74 kg/m   General: Well-appearing. Resting comfortably in room. CV: Normal S1/S2. No extra heart sounds. Warm and well-perfused. Pulm: Breathing comfortably on room air. CTAB. No increased WOB. Abd: Soft, non-tender, non-distended. Some suprapubic discomfort to palpation. No CVA tenderness.  Ext: Nontender, nonedematous, nonerythematous bilateral lower extremities. Normal radial  and DP pulses bilaterally.  Skin:  Warm, dry.  ASSESSMENT/PLAN:   Assessment & Plan Generalized anxiety disorder Patient with history of poorly controlled anxiety. Reports prior positive response to Prozac.  - Start Prozac 20 daily  - Continue trazodone 100 nightly PRN  - Discussed possible benefits of therapy and provided resources in AVS if patient becomes interested - RTC in 3 weeks for follow up  Insomnia due to other mental disorder Longstanding difficulty sleeping, has been taking trazodone with some help. Reported difficulty breathing at times concerning for sleep apnea.  - Continue trazodone 100 nightly PRN - Placed referral for sleep study to evaluate for sleep apnea  Cloudy urine UA today not concerning for UTI. Messaged patient through MyChart regarding these results.  Essential hypertension BP appears well-controlled today. Plan to continue BP regimen amlodipine 5 and losartan 50 daily. Refilled accordingly.   Unintentional weight loss Patient with continued unintentional weight loss, with recorded approximately 6 lb weight loss in clinic today compared to 10/18 clinic visit. No reported fever or night sweats. Last colonoscopy in Oct 2023 with numerous >10 pre-cancerous polyps, with recommendation for repeat colonoscopy in 1 year per GI. Last normal mammogram in July 2023, with recommendation for repeat in 1 year. Last Lung CT in 2022 with lung RADS 2.  - Discussed with patient regarding important follow up imaging given her continued weight loss. She expresses understanding but is not interested in any further imaging this year.  - Discussed and placed referral for colonoscopy and mammogram in case patient becomes interested. - Patient is scheduled to see her pulmonologist in February.   Patient to  return to clinic in 3 weeks to follow up on anxiety following start of Prozac. Plan to check in about unintentional weight loss and imaging at that time.   Ivery Quale, MD [facility]  Health [facility]

## 2023-04-17 NOTE — Patient Instructions (Addendum)
Thank you for visiting clinic today - it is always our pleasure to care for you.  Today we discussed: -For your mood, please begin taking Prozac (Fluoxetine) daily. If you notice worsening mood, please stop taking it and let us know.  -See below for therapy resources -We collected a urine sample for testing. I will let you know of the results.  -I have placed a referral for a sleep study. Someone will contact you for scheduling.  -Please continue to monitor your calf tingling and let us know if it worsens -You are due for a colonoscopy and mammogram. Please let us know if you become interested in these.   Please schedule an appointment in 3 weeks to follow up on your mood.   Reach out any time with any questions or concerns you may have - we are here for you!  Julie Quale, MD [facility] [phone]  Therapy and Counseling Resources Most providers on this list will take Medicaid. Patients with commercial insurance or Medicare should contact their insurance company to get a list of in network providers.  [facility]:Psychiatry and Counseling [address] [address] [place], [place] [address] [phone]  [facility]  [address] Suite [place], [place] [address]      [phone]  [facility] [address]  [place], [place] [address] [phone]  [facility] [address]  [place], [place] [address]       [phone]     [facility] (virtual only) [phone]  Jovita Kussmaul [facility] [address], [place], [place] [phone]  [facility]:  [address] [place] [phone]  [facility]:  [address] [place] [phone]  [facility] (under & uninsured) [address]   [place] [phone]    [email or web address]    [facility] [address]  [place]     [phone]  [facility] -[address]     Phone:  [phone]     [facility]-  [address]  [phone]   [facility] [address]      [place], [place] [phone]  [facility]: [address] [place], [place], [place]  [phone]   [facility] (Spanish therapist) [email or web address]  [address] [place]  [address]   [place] [address]    [phone]    [facility]   [address],  [place], [place]  [phone]   [facility]  [address] [place]  [phone]  [facility]  [address] [place], [place]        [phone]  Open Access/Walk In Clinic under & uninsured  [facility]  [address] [place], [place] [phone] Crisis [phone]  [facility] [place],  (Spanish)   [address], [place]: [phone]) 8:30 - 12; 1 - 2:30  [facility] [employer],  [address] [place], [place]    [phone]):8:30 - 12; 2 - 3PM  [facility] [employer],  [address]  [place]; [phone]):   Mon - Fri 8 AM - 5 PM  [facility] [address] [place]  MWF 12:30 to 3:00 or call to schedule an appointment  [phone]  Specific  Provider options Psychology Today  [email or web address]/us click on find a therapist  enter your zip code left side and select or tailor a therapist for your specific need.   [facility] Provider Directory [email or web address]  (Medicaid)   Follow all drop down to find a provider  Social Support program [facility] [place] [phone] or PhotoSolver.pl [address], [place], [place] Recovery support and educational   24- Hour Availability:   [facility]  [address] [place], [place]  [phone] Crisis [phone]  [facility] [phone]  [place] [facility]  [phone]   [facility]  [phone] (after hours)  Therapeutic Alternative/Mobile Crisis   [phone]  [place] National Suicide Hotline  [phone] Len Childs)  Call 911 or go to emergency room  [facility]  [phone]);  [place] and [employer]  [phone]); [place], [place], [place], [place], [place], [place], [place]

## 2023-04-18 ENCOUNTER — Encounter: Payer: Self-pay | Admitting: Family Medicine

## 2023-04-18 ENCOUNTER — Other Ambulatory Visit: Payer: Self-pay | Admitting: Family Medicine

## 2023-04-18 DIAGNOSIS — Z0000 Encounter for general adult medical examination without abnormal findings: Secondary | ICD-10-CM | POA: Insufficient documentation

## 2023-04-18 DIAGNOSIS — Z860100 Personal history of colon polyps, unspecified: Secondary | ICD-10-CM | POA: Insufficient documentation

## 2023-04-18 DIAGNOSIS — R8290 Unspecified abnormal findings in urine: Secondary | ICD-10-CM | POA: Insufficient documentation

## 2023-04-18 MED ORDER — FLUOXETINE HCL 20 MG PO CAPS: 20.0000 mg | ORAL_CAPSULE | Freq: Every day | ORAL | 2 refills | Status: DC

## 2023-04-18 NOTE — Assessment & Plan Note (Signed)
UA today not concerning for UTI. Messaged patient through MyChart regarding these results.

## 2023-04-18 NOTE — Progress Notes (Signed)
Received fax from patient's pharmacy regarding drug change request per patient's insurance plan preference. Updated to capsule form of fluoxetine hcl 20 mg daily. Discussed change with patient pharmacy.

## 2023-04-18 NOTE — Assessment & Plan Note (Signed)
BP appears well-controlled today. Plan to continue BP regimen amlodipine 5 and losartan 50 daily. Refilled accordingly.

## 2023-04-18 NOTE — Assessment & Plan Note (Signed)
Patient with continued unintentional weight loss, with recorded approximately 6 lb weight loss in clinic today compared to 10/18 clinic visit. No reported fever or night sweats. Last colonoscopy in Oct 2023 with numerous >10 pre-cancerous polyps, with recommendation for repeat colonoscopy in 1 year per GI. Last normal mammogram in July 2023, with recommendation for repeat in 1 year. Last Lung CT in 2022 with lung RADS 2.  - Discussed with patient regarding important follow up imaging given her continued weight loss. She expresses understanding but is not interested in any further imaging this year.  - Discussed and placed referral for colonoscopy and mammogram in case patient becomes interested. - Patient is scheduled to see her pulmonologist in February.

## 2023-04-18 NOTE — Assessment & Plan Note (Signed)
Longstanding difficulty sleeping, has been taking trazodone with some help. Reported difficulty breathing at times concerning for sleep apnea.  - Continue trazodone 100 nightly PRN - Placed referral for sleep study to evaluate for sleep apnea

## 2023-04-18 NOTE — Assessment & Plan Note (Signed)
Patient with history of poorly controlled anxiety. Reports prior positive response to Prozac.  - Start Prozac 20 daily  - Continue trazodone 100 nightly PRN  - Discussed possible benefits of therapy and provided resources in AVS if patient becomes interested - RTC in 3 weeks for follow up

## 2023-04-20 ENCOUNTER — Other Ambulatory Visit: Payer: Self-pay | Admitting: Physician Assistant

## 2023-05-03 DIAGNOSIS — Z419 Encounter for procedure for purposes other than remedying health state, unspecified: Secondary | ICD-10-CM | POA: Diagnosis not present

## 2023-05-12 ENCOUNTER — Ambulatory Visit (INDEPENDENT_AMBULATORY_CARE_PROVIDER_SITE_OTHER): Payer: Medicaid Other | Admitting: Student

## 2023-05-12 DIAGNOSIS — F418 Other specified anxiety disorders: Secondary | ICD-10-CM | POA: Diagnosis not present

## 2023-05-12 MED ORDER — FLUOXETINE HCL 40 MG PO CAPS: 40.0000 mg | ORAL_CAPSULE | Freq: Every day | ORAL | 3 refills | Status: DC

## 2023-05-12 NOTE — Patient Instructions (Signed)
 It was nice talking with you today. Glad that you are having some improvement in your symptoms. I sent your medication to your pharmacy.  The suicide and crisis hotline number is 63.  It is free and confidential.  If you feel hopeless, or that you need someone to talk to in a crisis then you may call this number  If you have any questions or concerns, please feel free to call the clinic.   Have a wonderful day,  Dr. Barabara Dama [facility] [phone]

## 2023-05-12 NOTE — Assessment & Plan Note (Signed)
 I am pleased to hear that she has had some improvement with the addition of fluoxetine . I will plan to increase her dosage of fluoxetine  from 20 mg daily to 40 mg daily.  She may take 2 of her current 20 mg tablets daily until she runs out of her current supply.  Then, take 1 tablet of fluoxetine  40 mg daily. Denies any SI or HI. Provided with suicide hotline number in her AVS and return precautions were also discussed.  Empathized with patient regarding the major life changes that she has been going through. Follow-up with PCP in the next 1 to 2 months to assess improvement.

## 2023-05-12 NOTE — Progress Notes (Signed)
 [facility] Telemedicine Visit  Patient consented to have virtual visit and was identified by name and date of birth. Method of visit: Telephone  Encounter participants: Patient: Julie Hensley - located at [address] [place] [address] [place], [place]  Provider: Barabara Dama - located at home Others (if applicable):   Chief Complaint:   HPI:  Julie Hensley is a 63 year-old female with a history of HTN, HLD, insomnia who would like to discuss medication adjustment for her antidepressant.  She went through the death of her boyfriend of 14 years. She then lost her job and had to move in with her mother. She is feeling a little bit less down since starting fluoxetine  20 mg daily on 04/17/2023.  She has noticed improvement says that the medication feels like it takes the edge off but that she feels like she needs to go up to the next dose for maximum results.  Doing well with appetite and sleeping.  No thoughts of self-harm or suicidal ideation.  ROS: per HPI  Pertinent PMHx: Insomnia, HTN, HLD  Exam:  There were no vitals taken for this visit.  Respiratory: Speaks in full sentences.  No appreciable labored breathing.  Assessment/Plan:  Depression with anxiety I am pleased to hear that she has had some improvement with the addition of fluoxetine . I will plan to increase her dosage of fluoxetine  from 20 mg daily to 40 mg daily.  She may take 2 of her current 20 mg tablets daily until she runs out of her current supply.  Then, take 1 tablet of fluoxetine  40 mg daily. Denies any SI or HI. Provided with suicide hotline number in her AVS and return precautions were also discussed.  Empathized with patient regarding the major life changes that she has been going through. Follow-up with PCP in the next 1 to 2 months to assess improvement.    Time spent during visit with patient: 10 minutes

## 2023-05-20 ENCOUNTER — Other Ambulatory Visit: Payer: Self-pay | Admitting: Physician Assistant

## 2023-05-22 ENCOUNTER — Ambulatory Visit: Payer: Medicaid Other | Admitting: Pulmonary Disease

## 2023-06-03 DIAGNOSIS — Z419 Encounter for procedure for purposes other than remedying health state, unspecified: Secondary | ICD-10-CM | POA: Diagnosis not present

## 2023-06-15 ENCOUNTER — Other Ambulatory Visit: Payer: Self-pay

## 2023-06-15 MED ORDER — OMEPRAZOLE 40 MG PO CPDR: DELAYED_RELEASE_CAPSULE | ORAL | 0 refills | Status: DC

## 2023-07-01 DIAGNOSIS — Z419 Encounter for procedure for purposes other than remedying health state, unspecified: Secondary | ICD-10-CM | POA: Diagnosis not present

## 2023-07-05 ENCOUNTER — Ambulatory Visit: Payer: Medicaid Other | Admitting: Pulmonary Disease

## 2023-07-14 ENCOUNTER — Other Ambulatory Visit: Payer: Self-pay | Admitting: Family Medicine

## 2023-07-14 ENCOUNTER — Other Ambulatory Visit: Payer: Self-pay

## 2023-07-14 DIAGNOSIS — F411 Generalized anxiety disorder: Secondary | ICD-10-CM

## 2023-07-14 DIAGNOSIS — F5105 Insomnia due to other mental disorder: Secondary | ICD-10-CM

## 2023-07-14 MED ORDER — TRAZODONE HCL 100 MG PO TABS: ORAL_TABLET | ORAL | 1 refills | Status: DC

## 2023-07-14 NOTE — Telephone Encounter (Signed)
 Chart reviewed. Rx refilled.

## 2023-08-12 DIAGNOSIS — Z419 Encounter for procedure for purposes other than remedying health state, unspecified: Secondary | ICD-10-CM | POA: Diagnosis not present

## 2023-08-24 ENCOUNTER — Other Ambulatory Visit: Payer: Self-pay

## 2023-08-24 DIAGNOSIS — J449 Chronic obstructive pulmonary disease, unspecified: Secondary | ICD-10-CM

## 2023-08-24 NOTE — Telephone Encounter (Signed)
 Patient calls nurse line requesting refills on omeprazole  and albuterol  inhaler.   She states that she is no longer seeing the GI specialist and would like to receive refill from Dr. Fernand Howard.   Forwarding request to PCP.   Elsie Halo, RN

## 2023-08-28 ENCOUNTER — Other Ambulatory Visit: Payer: Self-pay | Admitting: Family Medicine

## 2023-08-28 MED ORDER — OMEPRAZOLE 40 MG PO CPDR: DELAYED_RELEASE_CAPSULE | ORAL | 0 refills | Status: DC

## 2023-08-28 MED ORDER — ALBUTEROL SULFATE HFA 108 (90 BASE) MCG/ACT IN AERS: INHALATION_SPRAY | RESPIRATORY_TRACT | 2 refills | Status: DC

## 2023-08-28 NOTE — Telephone Encounter (Signed)
 Chart reviewed. Rx refilled.

## 2023-08-29 DIAGNOSIS — H5213 Myopia, bilateral: Secondary | ICD-10-CM | POA: Diagnosis not present

## 2023-08-30 ENCOUNTER — Encounter: Payer: Self-pay | Admitting: Pulmonary Disease

## 2023-08-30 ENCOUNTER — Ambulatory Visit: Admitting: Pulmonary Disease

## 2023-08-30 VITALS — BP 134/83 | HR 73 | Ht 62.0 in | Wt 131.2 lb

## 2023-08-30 DIAGNOSIS — Z720 Tobacco use: Secondary | ICD-10-CM

## 2023-08-30 DIAGNOSIS — J4540 Moderate persistent asthma, uncomplicated: Secondary | ICD-10-CM

## 2023-08-30 DIAGNOSIS — J45909 Unspecified asthma, uncomplicated: Secondary | ICD-10-CM | POA: Diagnosis not present

## 2023-08-30 DIAGNOSIS — J439 Emphysema, unspecified: Secondary | ICD-10-CM | POA: Diagnosis not present

## 2023-08-30 DIAGNOSIS — F17210 Nicotine dependence, cigarettes, uncomplicated: Secondary | ICD-10-CM | POA: Diagnosis not present

## 2023-08-30 MED ORDER — TRELEGY ELLIPTA 200-62.5-25 MCG/ACT IN AEPB: INHALATION_SPRAY | RESPIRATORY_TRACT | 11 refills | Status: AC

## 2023-08-30 NOTE — Patient Instructions (Signed)
 Nice to see you again  I refilled Trelegy  Try cetirizine, this is an antihistamine.  This generic name for Zyrtec.  Take at night before bed.  Some people feel little sleepy after taking it.  See if this helps with the scratchy throat and the runny nose.  Return to clinic in 1 year or sooner as needed with Dr. Marygrace Snellen

## 2023-08-30 NOTE — Progress Notes (Signed)
 @Patient  ID: Julie Hensley, female    DOB: 07/29/1960, 63 y.o.   MRN: [id number]  Chief Complaint  Patient presents with   Follow-up    64yr follow up    Referring provider: Carey Chapman, MD  HPI:   63 y.o. woman whom we are seeing in follow-up for evaluation of dyspnea on exertion related to asthma.  Symptoms greatly improved with high-dose Trelegy.  PFTs 03/2021 normal without fixed obstruction, normal DLCO, normal lung volumes.  Overall patient doing quite well.  She states Trelegy high-dose has helped very much.  Rare if any albuterol  use over the last year.  Breathing feels good.  No issues with bronchitis.  No exacerbations.  No prednisone  use in the interim.  Some scratchy throat, runny nose for the last month.  Like related problems.  We discussed antihistamine use.  HPI initial visit: Patient with dyspnea on exertion.  Worse with inclines or stairs.  She is on various inhalers.  Told she has COPD based on what sounds like imaging findings in the past.  She currently uses Advair/Wixela as prescribed.  Does not think it helps very much.  No time of day where breathing better or worse.  She got the device seasonal environmental factors make things better or worse.  No other relieving or exacerbating factors.  In the she was seen in the emergency room 09/18/2020.  That note is reviewed.  She was diagnosed with a UTI and COPD exacerbation given her symptoms.  She was given Augmentin  and prednisone .  Chest x-ray reviewed that encounter 08/2020 reveals clear lungs with mild hyperinflation on my interpretation.  Reviewed CT scan August/2016 the monitor data shows mild emphysema in the apices, bibasilar linear atelectasis.  Do not see any record of pulmonary function test performed.  She does not think she is ever had these done.  PMH: Tobacco abuse Surgical history: Appendectomy, tonsillectomy Family history: Mother with hypertension, CAD, COPD, father with hypertension, CVA Social history:  Current smoker about 20 pack years, lives in [place] / Pulmonary Flowsheets:   ACT:  Asthma Control Test ACT Total Score  08/30/2023  2:11 PM 21  03/03/2022  1:13 PM 20   MMRC:     No data to display          Epworth:      No data to display          Tests:   FENO:  No results found for: "NITRICOXIDE"  PFT:    Latest Ref Rng & Units 03/04/2021    8:58 AM  PFT Results  FVC-Pre L 3.03   FVC-Predicted Pre % 95   FVC-Post L 3.12   FVC-Predicted Post % 98   Pre FEV1/FVC % % 79   Post FEV1/FCV % % 77   FEV1-Pre L 2.38   FEV1-Predicted Pre % 96   FEV1-Post L 2.42   DLCO uncorrected ml/min/mmHg 17.47   DLCO UNC% % 89   DLCO corrected ml/min/mmHg 17.47   DLCO COR %Predicted % 89   DLVA Predicted % 89   TLC L 4.49   TLC % Predicted % 91   RV % Predicted % 73   Personally reviewed and interpreted as normal spirometry, no bronchodilator spots, lung volumes within normal limits, DLCO within normal limits.  WALK:      No data to display          Imaging: Personally reviewed and as per EMR discussion of this note  Lab Results:  Personally reviewed CBC    Component Value Date/Time   WBC 6.7 12/12/2022 1459   WBC 10.0 12/16/2014 1850   RBC 5.25 12/12/2022 1459   RBC 5.23 (H) 12/16/2014 1850   HGB 15.3 12/12/2022 1459   HCT 46.8 (H) 12/12/2022 1459   PLT 194 12/12/2022 1459   MCV 89 12/12/2022 1459   MCH 29.1 12/12/2022 1459   MCH 30.4 12/16/2014 1850   MCHC 32.7 12/12/2022 1459   MCHC 34.9 12/16/2014 1850   RDW 13.5 12/12/2022 1459   LYMPHSABS 1.5 11/05/2021 1614   MONOABS 0.4 06/10/2014 1015   EOSABS 0.2 11/05/2021 1614   BASOSABS 0.1 11/05/2021 1614    BMET    Component Value Date/Time   NA 143 12/12/2022 1459   K 3.8 12/12/2022 1459   CL 103 12/12/2022 1459   CO2 22 12/12/2022 1459   GLUCOSE 73 12/12/2022 1459   GLUCOSE 132 (H) 12/16/2014 1923   BUN 9 12/12/2022 1459   CREATININE 0.80 12/12/2022 1459   CREATININE  0.75 06/10/2014 1015   CALCIUM  10.1 12/12/2022 1459   GFRNONAA 37 (L) 12/16/2014 1850   GFRNONAA >89 06/10/2014 1015   GFRAA 43 (L) 12/16/2014 1850   GFRAA >89 06/10/2014 1015    BNP No results found for: "BNP"  ProBNP No results found for: "PROBNP"  Specialty Problems       Pulmonary Problems   Asthma    No Known Allergies  Immunization History  Administered Date(s) Administered   Moderna Sars-Covid-2 Vaccination 01/01/2020, 01/29/2020    Past Medical History:  Diagnosis Date   Acid reflux    Anxiety    Arthritis    Asthma 03/04/2021   Chronic headaches    Complication of anesthesia    COPD (chronic obstructive pulmonary disease) (HCC)    GERD (gastroesophageal reflux disease)    Hypertension    HBP at [employer] machine but never been to doctor for it.    PONV (postoperative nausea and vomiting)     Tobacco History: Social History   Tobacco Use  Smoking Status Every Day   Current packs/day: 0.50   Average packs/day: 0.5 packs/day for 40.0 years (20.0 ttl pk-yrs)   Types: Cigarettes  Smokeless Tobacco Never  Tobacco Comments   0.5 ppd 08/30/2023   Ready to quit: Not Answered Counseling given: Not Answered Tobacco comments: 0.5 ppd 08/30/2023   Continue to not smoke  Outpatient Encounter Medications as of 08/30/2023  Medication Sig   acetaminophen  (TYLENOL ) 500 MG tablet Take 1,000-1,500 mg by mouth daily as needed for headache.   albuterol  (VENTOLIN  HFA) 108 (90 Base) MCG/ACT inhaler INHALE 2 PUFFS INTO LUNGS UP TO EVERY 4 HOURS AS NEEDED   amitriptyline  (ELAVIL ) 100 MG tablet Take 100 mg by mouth at bedtime as needed.   amLODipine  (NORVASC ) 5 MG tablet Take 1 tablet (5 mg total) by mouth daily.   losartan  (COZAAR ) 50 MG tablet Take 1 tablet (50 mg total) by mouth daily.   omeprazole  (PRILOSEC) 40 MG capsule Take 40 mg by mouth once or twice as needed to control symptoms.   rosuvastatin  (CRESTOR ) 20 MG tablet TAKE 1 TABLET BY MOUTH EVERY DAY   senna  (SENOKOT) 8.6 MG TABS tablet Take 1 tablet (8.6 mg total) by mouth daily.   traZODone  (DESYREL ) 100 MG tablet TAKE 1 TABLET BY MOUTH EVERY DAY AT BEDTIME AS NEEDED FOR SLEEP   [DISCONTINUED] Fluticasone -Umeclidin-Vilant (TRELEGY ELLIPTA ) 200-62.5-25 MCG/ACT AEPB TAKE 1 PUFF BY MOUTH EVERY DAY   Fluticasone -Umeclidin-Vilant (TRELEGY  ELLIPTA) 200-62.5-25 MCG/ACT AEPB TAKE 1 PUFF BY MOUTH EVERY DAY   [DISCONTINUED] FLUoxetine  (PROZAC ) 40 MG capsule Take 1 capsule (40 mg total) by mouth daily.   No facility-administered encounter medications on file as of 08/30/2023.     Review of Systems  Review of Systems  N/a  Physical Exam  BP 134/83 (BP Location: Left Arm, Patient Position: Sitting, Cuff Size: Normal)   Pulse 73   Ht 5\' 2"  (1.575 m)   Wt 131 lb 3.2 oz (59.5 kg)   SpO2 95%   BMI 24.00 kg/m   Wt Readings from Last 5 Encounters:  08/30/23 131 lb 3.2 oz (59.5 kg)  04/17/23 129 lb 12.8 oz (58.9 kg)  03/03/22 134 lb 12.8 oz (61.1 kg)  02/17/22 135 lb (61.2 kg)  01/14/22 138 lb 12.8 oz (63 kg)    BMI Readings from Last 5 Encounters:  08/30/23 24.00 kg/m  04/17/23 23.74 kg/m  03/03/22 24.66 kg/m  02/17/22 24.89 kg/m  01/14/22 25.59 kg/m     Physical Exam General: Well-appearing, sitting in chair Eyes: EOMI, icterus Neck: Supple no JVP Cardiovascular regular rhythm, no murmurs Pulmonary: Distant, normal work of breathing, no wheeze or crackle Abdomen: Nondistended, bowel sounds present MSK: No synovitis, joint effusion Neuro: Normal gait, no weakness Psych: Normal mood, full affect   Assessment & Plan:   Dyspnea on exertion due to asthma: PFTs normal without fixed obstruction.  No gas trapping, DLCO within normal limits.  Environmental triggers, atopic symptoms.  Improved to resolved with high-dose Trelegy.  Related to asthma.  Trelegy refilled today.  Lung cancer screening: Lung RADS 2 11/2020, due for repeat 11/2021, never performed. Will message to get her back  in program..  Emphysema: Related to cigarette smoking.   Return in about 1 year (around 08/29/2024) for f/u Dr. Marygrace Snellen.   Guerry Leek, MD 08/30/2023

## 2023-08-31 ENCOUNTER — Other Ambulatory Visit: Payer: Self-pay | Admitting: Family Medicine

## 2023-08-31 NOTE — Telephone Encounter (Signed)
 Chart reviewed. Rx adjusted.

## 2023-09-11 DIAGNOSIS — Z419 Encounter for procedure for purposes other than remedying health state, unspecified: Secondary | ICD-10-CM | POA: Diagnosis not present

## 2023-09-20 DIAGNOSIS — H5213 Myopia, bilateral: Secondary | ICD-10-CM | POA: Diagnosis not present

## 2023-10-12 DIAGNOSIS — Z419 Encounter for procedure for purposes other than remedying health state, unspecified: Secondary | ICD-10-CM | POA: Diagnosis not present

## 2023-10-18 ENCOUNTER — Other Ambulatory Visit: Payer: Self-pay | Admitting: Family Medicine

## 2023-10-18 DIAGNOSIS — F411 Generalized anxiety disorder: Secondary | ICD-10-CM

## 2023-10-18 DIAGNOSIS — F5105 Insomnia due to other mental disorder: Secondary | ICD-10-CM

## 2023-11-10 ENCOUNTER — Other Ambulatory Visit: Payer: Self-pay | Admitting: Family Medicine

## 2023-11-10 NOTE — Telephone Encounter (Signed)
 Chart reviewed. Rx refilled.

## 2023-11-11 ENCOUNTER — Other Ambulatory Visit: Payer: Self-pay | Admitting: Family Medicine

## 2023-11-11 DIAGNOSIS — Z419 Encounter for procedure for purposes other than remedying health state, unspecified: Secondary | ICD-10-CM | POA: Diagnosis not present

## 2023-12-12 DIAGNOSIS — Z419 Encounter for procedure for purposes other than remedying health state, unspecified: Secondary | ICD-10-CM | POA: Diagnosis not present

## 2024-01-11 ENCOUNTER — Other Ambulatory Visit: Payer: Self-pay | Admitting: Family Medicine

## 2024-01-11 DIAGNOSIS — F5105 Insomnia due to other mental disorder: Secondary | ICD-10-CM

## 2024-01-11 DIAGNOSIS — F411 Generalized anxiety disorder: Secondary | ICD-10-CM

## 2024-01-12 DIAGNOSIS — Z419 Encounter for procedure for purposes other than remedying health state, unspecified: Secondary | ICD-10-CM | POA: Diagnosis not present

## 2024-01-12 NOTE — Telephone Encounter (Signed)
 Chart reviewed. Rx refilled. Requesting office visit.

## 2024-02-11 ENCOUNTER — Other Ambulatory Visit: Payer: Self-pay | Admitting: Family Medicine

## 2024-02-11 DIAGNOSIS — F411 Generalized anxiety disorder: Secondary | ICD-10-CM

## 2024-02-11 DIAGNOSIS — F5105 Insomnia due to other mental disorder: Secondary | ICD-10-CM

## 2024-02-12 ENCOUNTER — Other Ambulatory Visit: Payer: Self-pay | Admitting: Family Medicine

## 2024-02-12 ENCOUNTER — Encounter: Payer: Self-pay | Admitting: Family Medicine

## 2024-02-12 ENCOUNTER — Ambulatory Visit (INDEPENDENT_AMBULATORY_CARE_PROVIDER_SITE_OTHER): Admitting: Family Medicine

## 2024-02-12 VITALS — BP 135/60 | HR 76 | Ht 62.0 in | Wt 138.4 lb

## 2024-02-12 DIAGNOSIS — F411 Generalized anxiety disorder: Secondary | ICD-10-CM

## 2024-02-12 DIAGNOSIS — Z0000 Encounter for general adult medical examination without abnormal findings: Secondary | ICD-10-CM

## 2024-02-12 DIAGNOSIS — Z720 Tobacco use: Secondary | ICD-10-CM | POA: Diagnosis not present

## 2024-02-12 DIAGNOSIS — G479 Sleep disorder, unspecified: Secondary | ICD-10-CM

## 2024-02-12 LAB — POCT GLYCOSYLATED HEMOGLOBIN (HGB A1C): Hemoglobin A1C: 5.3 % (ref 4.0–5.6)

## 2024-02-12 MED ORDER — ROSUVASTATIN CALCIUM 20 MG PO TABS: 20.0000 mg | ORAL_TABLET | Freq: Every day | ORAL | 1 refills | Status: DC

## 2024-02-12 MED ORDER — VARENICLINE TARTRATE 0.5 MG PO TABS: ORAL_TABLET | ORAL | 0 refills | Status: AC

## 2024-02-12 MED ORDER — DOXEPIN HCL 6 MG PO TABS: 1.0000 | ORAL_TABLET | Freq: Every evening | ORAL | 0 refills | Status: DC | PRN

## 2024-02-12 NOTE — Patient Instructions (Signed)
 Thank you for visiting clinic today and allowing us  to participate in your care!  Try taking doxepin at bedtime as needed for sleep. Stop taking the trazodone . See attached for sleep hygiene recommendations.   Pick a quit date for smoking. Plan to start the Chantix 10 days prior to this date. Take the Chantix as prescribed.   Please schedule an appointment in 2-3 weeks for follow up.   Reach out any time with any questions or concerns you may have - we are here for you!  Damien Cassis, MD [facility] [phone]   Therapy and Counseling Resources Most providers on this list will take Medicaid. Patients with commercial insurance or Medicare should contact their insurance company to get a list of in network providers.  [facility]:Psychiatry and Counseling [address] [place], [place] [address] [phone]  [facility]  [address] [place], [place] [address]      [phone]  [facility] [address]  [place], [place] [address] [phone]  [facility] [address]  [place], [place] [address]       [phone]     [facility] (virtual only) [phone]  [facility] [address], [place], [place] [phone]  [facility]:  [address] [place] [phone]  [facility]:  [address] [place] [phone]  [facility] (under & uninsured) [address]   [place] [phone]    [email or web address]    [facility] [address]  [place]    [phone]  [facility] -[address]     Phone:  [phone]     [facility]  [address]  [phone]   [facility] [address]      [place], [place] [phone]  [facility]: [address] [place],  [place], [place]  [phone]   [facility] (Spanish therapist) [email or web address]  [address] [place]  [address]   [place] [address]    [phone]    [facility]   [address],  [place], [place]  [phone]   [facility]  [address] [place]  [phone]  [facility]  [address] [place], [place]        [phone]  Open Access/Walk In Clinic under & uninsured  [facility]  [address] [place], [place] [phone] Crisis [phone]  [facility] [address],  (Spanish)   [address] , [place]: [phone]) 8:30 - 12; 1 - 2:30  [facility] [employer],  [address] [place], [place]    [phone]):8:30 - 12; 2 - 3PM  [facility] [employer],  [address]  [place]; [phone]):   Mon - Fri 8 AM - 5 PM  [facility] [address] [place]  MWF 12:30 to 3:00 or call to schedule an appointment  [phone]  Specific Provider options Psychology Today  [email or web address]/us  click on find a therapist  enter your zip code left side and select or tailor a therapist for your specific need.   [facility] Provider Directory [email or web address]  (Medicaid)   Follow all drop down to find a provider  Social Support program [facility] [place] [employer] [phone]  or PhotoSolver.pl [address], [place], [place] Recovery support and educational   24- Hour Availability:   [facility]  [address] [place], [place] [phone] Crisis [phone]  [facility] [phone]  [facility]  [phone]   [facility]  [phone] (after hours)  Therapeutic Alternative/Mobile Crisis   [phone]  [place]  National Suicide Hotline   [phone] MERRILYN)  Call 911 or go to emergency room  [facility]  [phone]);  [place] and [employer]  [phone]); [place], [place], [place], [place], [place], [place], [place]

## 2024-02-12 NOTE — Progress Notes (Unsigned)
    SUBJECTIVE:   CHIEF COMPLAINT / HPI:   Depression, anxiety Mood okay No SI/HI Declines meds and therapy   Sleep disturbance Using trazodone  300  Sleeps 3 hours a night  -  Outpatient Encounter Medications as of 02/12/2024  Medication Sig   acetaminophen  (TYLENOL ) 500 MG tablet Take 1,000-1,500 mg by mouth daily as needed for headache.   albuterol  (VENTOLIN  HFA) 108 (90 Base) MCG/ACT inhaler INHALE 2 PUFFS INTO LUNGS UP TO EVERY 4 HOURS AS NEEDED   amLODipine  (NORVASC ) 5 MG tablet Take 1 tablet (5 mg total) by mouth daily.   Doxepin HCl 6 MG TABS Take 1 tablet (6 mg total) by mouth at bedtime as needed.   Fluticasone -Umeclidin-Vilant (TRELEGY ELLIPTA ) 200-62.5-25 MCG/ACT AEPB TAKE 1 PUFF BY MOUTH EVERY DAY   losartan  (COZAAR ) 50 MG tablet Take 1 tablet (50 mg total) by mouth daily.   omeprazole  (PRILOSEC) 40 MG capsule TAKE 1 CAPSULE BY MOUTH 1 TO 2 TIMES DAILY AS NEEDED FOR SYMPTOMS   senna (SENOKOT) 8.6 MG TABS tablet Take 1 tablet (8.6 mg total) by mouth daily.   varenicline (CHANTIX) 0.5 MG tablet 0.5 mg orally once daily for 3 days, then 0.5 mg twice daily on days 4 through 7, and then 1 mg twice daily thereafter for 12 weeks of treatment.   [DISCONTINUED] rosuvastatin  (CRESTOR ) 20 MG tablet TAKE 1 TABLET BY MOUTH EVERY DAY   [DISCONTINUED] traZODone  (DESYREL ) 100 MG tablet TAKE 1 TABLET BY MOUTH EVERY DAY AT BEDTIME AS NEEDED FOR SLEEP   rosuvastatin  (CRESTOR ) 20 MG tablet Take 1 tablet (20 mg total) by mouth daily.   [DISCONTINUED] amitriptyline  (ELAVIL ) 100 MG tablet Take 100 mg by mouth at bedtime as needed.   No facility-administered encounter medications on file as of 02/12/2024.     PERTINENT  PMH / PSH: ***  OBJECTIVE:   BP 135/60   Pulse 76   Ht 5' 2 (1.575 m)   Wt 138 lb 6.4 oz (62.8 kg)   SpO2 97%   BMI 25.31 kg/m   General: No acute distress. Resting comfortably in room. CV: Normal S1/S2. No extra heart sounds. Warm and well-perfused. Pulm:  Breathing comfortably on room air. CTAB. No increased WOB. Abd: Soft, non-tender, non-distended. Skin:  Warm, dry. No extremity swelling.  Psych: Pleasant and appropriate.    ASSESSMENT/PLAN:   Assessment & Plan Healthcare maintenance      Damien Cassis, MD [facility]

## 2024-02-13 LAB — LIPID PANEL
Chol/HDL Ratio: 5.3 ratio — ABNORMAL HIGH (ref 0.0–4.4)
Cholesterol, Total: 231 mg/dL — ABNORMAL HIGH (ref 100–199)
HDL: 44 mg/dL (ref >39)
LDL Chol Calc (NIH): 157 mg/dL — ABNORMAL HIGH (ref 0–99)
Triglycerides: 163 mg/dL — ABNORMAL HIGH (ref 0–149)
VLDL Cholesterol Cal: 30 mg/dL (ref 5–40)

## 2024-02-13 LAB — BASIC METABOLIC PANEL WITH GFR
BUN/Creatinine Ratio: 9 — ABNORMAL LOW (ref 12–28)
BUN: 7 mg/dL — ABNORMAL LOW (ref 8–27)
CO2: 26 mmol/L (ref 20–29)
Calcium: 10.1 mg/dL (ref 8.7–10.3)
Chloride: 101 mmol/L (ref 96–106)
Creatinine, Ser: 0.82 mg/dL (ref 0.57–1.00)
Glucose: 77 mg/dL (ref 70–99)
Potassium: 4.8 mmol/L (ref 3.5–5.2)
Sodium: 139 mmol/L (ref 134–144)
eGFR: 80 mL/min/1.73 (ref >59)

## 2024-02-14 ENCOUNTER — Other Ambulatory Visit (HOSPITAL_COMMUNITY): Payer: Self-pay

## 2024-02-14 ENCOUNTER — Telehealth: Payer: Self-pay

## 2024-02-14 ENCOUNTER — Ambulatory Visit: Payer: Self-pay | Admitting: Family Medicine

## 2024-02-14 NOTE — Assessment & Plan Note (Signed)
 Mood overall appears stable. No SI/HI. Patient declines medications for mood and therapy at this time.

## 2024-02-14 NOTE — Telephone Encounter (Signed)
 Prior authorization submitted for DOXEPIN 6MG  to [facility] MEDICAID via Latent.   Key: [id number]

## 2024-02-14 NOTE — Assessment & Plan Note (Signed)
 Patient expresses interest in quitting smoking. Congratulated and encouraged patient in this endeavor. Discussed Chantix regimen, plan for start 10-14 days prior to desired quit date. Patient declines meeting with clinical pharmacist at this time.

## 2024-02-14 NOTE — Assessment & Plan Note (Signed)
 Patient interested in routine labwork today. BMP, A1c, lipid panel ordered.

## 2024-02-15 ENCOUNTER — Encounter: Payer: Self-pay | Admitting: Family Medicine

## 2024-02-15 ENCOUNTER — Other Ambulatory Visit: Payer: Self-pay | Admitting: Family Medicine

## 2024-02-15 MED ORDER — ROSUVASTATIN CALCIUM 20 MG PO TABS: 20.0000 mg | ORAL_TABLET | Freq: Every day | ORAL | 3 refills | Status: DC

## 2024-02-15 MED ORDER — RAMELTEON 8 MG PO TABS: 8.0000 mg | ORAL_TABLET | Freq: Every day | ORAL | 0 refills | Status: DC

## 2024-02-15 NOTE — Telephone Encounter (Signed)
 Pharmacy Patient Advocate Encounter  Received notification from [facility] MEDICAID that Prior Authorization for DOXEPIN 6MG  has been DENIED.  Full denial letter will be uploaded to the media tab. See denial reason below.  Per the health plan's preferred drug list, at least 2 preferred drugs must be tried before requesting this drug or tell us  why the member cannot try any preferred alternatives. Please send us  supporting chart notes and lab results.   Here is list of preferred alternatives: eszopiclone tablet (generic for Lunesta), flurazepam capsule (generic for Dalmane), ramelteon tablet (generic for Rozerem Tablet), temazepam 15mg , 30mg  capsule (generic for Restoril), zaleplon capsule (generic for Sonata), zolpidem tablet (generic for Ambien), zolpidem ER tablet (generic for Ambien CR). Note: Some preferred drug(s) may have quantity limits.   PA #/Case ID/Reference #: [id number]

## 2024-02-15 NOTE — Progress Notes (Signed)
 Received notice that Doxepin prior authorization was denied. Will trial Ramelteon instead, which is listed as a preferred alternative per medicaid.

## 2024-02-27 ENCOUNTER — Other Ambulatory Visit (HOSPITAL_COMMUNITY): Payer: Self-pay

## 2024-02-27 NOTE — Telephone Encounter (Signed)
 Ramelteon 8mg  - max fill #15 per 22 days

## 2024-03-09 ENCOUNTER — Other Ambulatory Visit: Payer: Self-pay | Admitting: Family Medicine

## 2024-03-09 DIAGNOSIS — I10 Essential (primary) hypertension: Secondary | ICD-10-CM

## 2024-03-11 ENCOUNTER — Other Ambulatory Visit: Payer: Self-pay | Admitting: Family Medicine

## 2024-03-11 MED ORDER — RAMELTEON 8 MG PO TABS: 8.0000 mg | ORAL_TABLET | Freq: Every day | ORAL | 0 refills | Status: DC

## 2024-03-11 MED ORDER — ROSUVASTATIN CALCIUM 20 MG PO TABS
20.0000 mg | ORAL_TABLET | Freq: Every day | ORAL | 3 refills | Status: AC
Start: 2024-03-11 — End: ?

## 2024-03-11 NOTE — Telephone Encounter (Signed)
 Chart reviewed. Rx refilled.

## 2024-03-11 NOTE — Progress Notes (Signed)
 Refilled rosuvastatin  and updated ramelteon quantity to 15 tabs.

## 2024-03-13 ENCOUNTER — Other Ambulatory Visit: Payer: Self-pay | Admitting: *Deleted

## 2024-03-13 DIAGNOSIS — J449 Chronic obstructive pulmonary disease, unspecified: Secondary | ICD-10-CM

## 2024-03-14 MED ORDER — ALBUTEROL SULFATE HFA 108 (90 BASE) MCG/ACT IN AERS: INHALATION_SPRAY | RESPIRATORY_TRACT | 11 refills | Status: DC

## 2024-03-14 NOTE — Telephone Encounter (Signed)
 Chart reviewed. Rx refilled.

## 2024-04-01 ENCOUNTER — Other Ambulatory Visit: Payer: Self-pay | Admitting: Family Medicine

## 2024-04-01 MED ORDER — RAMELTEON 8 MG PO TABS: 8.0000 mg | ORAL_TABLET | Freq: Every day | ORAL | 3 refills | Status: DC

## 2024-04-01 NOTE — Progress Notes (Signed)
 Ramelteon  refilled.

## 2024-04-05 ENCOUNTER — Other Ambulatory Visit: Payer: Self-pay | Admitting: *Deleted

## 2024-04-05 DIAGNOSIS — J449 Chronic obstructive pulmonary disease, unspecified: Secondary | ICD-10-CM

## 2024-04-08 ENCOUNTER — Other Ambulatory Visit: Payer: Self-pay

## 2024-04-08 ENCOUNTER — Telehealth: Payer: Self-pay | Admitting: Family Medicine

## 2024-04-08 ENCOUNTER — Encounter: Payer: Self-pay | Admitting: Family Medicine

## 2024-04-08 ENCOUNTER — Ambulatory Visit: Payer: Self-pay | Admitting: Family Medicine

## 2024-04-08 DIAGNOSIS — J449 Chronic obstructive pulmonary disease, unspecified: Secondary | ICD-10-CM

## 2024-04-08 MED ORDER — ALBUTEROL SULFATE HFA 108 (90 BASE) MCG/ACT IN AERS: INHALATION_SPRAY | RESPIRATORY_TRACT | 11 refills | Status: AC

## 2024-04-08 NOTE — Telephone Encounter (Signed)
 [facility] Telemedicine Visit  Attempted to call patient x 2 for telemedicine visit, no answer, left general voicemail.  Also attempted to call patient contact in chart, no answer, left general voicemail.  MyChart message sent.  Plan to reschedule as patient availability allows.

## 2024-04-08 NOTE — Progress Notes (Unsigned)
 [facility] Telemedicine Visit  Attempted to call patient x 2 for telemedicine visit, no answer, left general voicemail.  Also attempted to call patient contact in chart, no answer, left general voicemail.  MyChart message sent.  Plan to reschedule as patient availability allows.

## 2024-04-10 ENCOUNTER — Other Ambulatory Visit (HOSPITAL_COMMUNITY): Payer: Self-pay

## 2024-04-10 NOTE — Telephone Encounter (Signed)
 A user error has taken place: encounter opened in error, closed for administrative reasons.

## 2024-04-12 DIAGNOSIS — Z419 Encounter for procedure for purposes other than remedying health state, unspecified: Secondary | ICD-10-CM | POA: Diagnosis not present

## 2024-04-29 ENCOUNTER — Ambulatory Visit: Admitting: Family Medicine

## 2024-04-29 ENCOUNTER — Encounter: Payer: Self-pay | Admitting: Family Medicine

## 2024-04-29 DIAGNOSIS — G4700 Insomnia, unspecified: Secondary | ICD-10-CM

## 2024-04-29 NOTE — Progress Notes (Signed)
 Attempted to call x2, no response, left voicemail informing of visit.   Reattempted call x2, no response, left voicemail, MyChart message sent. Will hopefully reschedule soon at patient's earliest convenience.

## 2024-05-07 ENCOUNTER — Telehealth: Payer: Self-pay | Admitting: Family Medicine

## 2024-05-07 DIAGNOSIS — G4700 Insomnia, unspecified: Secondary | ICD-10-CM

## 2024-05-07 DIAGNOSIS — F411 Generalized anxiety disorder: Secondary | ICD-10-CM | POA: Diagnosis not present

## 2024-05-07 MED ORDER — MIRTAZAPINE 7.5 MG PO TABS: 7.5000 mg | ORAL_TABLET | Freq: Every day | ORAL | 1 refills | Status: DC

## 2024-05-07 NOTE — Progress Notes (Signed)
 [facility] Telemedicine Visit  Patient consented to have virtual visit and was identified by name and date of birth. Method of visit: Telephone  Encounter participants: Patient: Julie Hensley - located at home Provider: Damien Cassis - located at [facility]  Chief Complaint: sleep   HPI: Up all night, sleep through day, every day Doesn't fall asleep 8 hours before falling asleep Ramelteon  did not help at all  Interested in SSRI Feeling anxious, especially  No SI/HI   ***  ROS: per HPI  Pertinent PMHx: ***  Exam:  There were no vitals taken for this visit.  Respiratory: Speaking in full sentences. No respiraotry distress.  Assessment/Plan:  No problem-specific Assessment & Plan notes found for this encounter.    Time spent during visit with patient: 14 minutes  {Billing info - this will automatically delete when the note is signed:1} {For Audio only, bill [id number] / [phone] as usual with modifier 93 attached:1} {For Audio & Video, bill [id number] / [phone] as usual with modifier 95 attached:1}

## 2024-05-08 NOTE — Assessment & Plan Note (Signed)
 Patient with continued insomnia and anxiety symptoms. No SI/HI. Previously on trazodone  though required increasing doses, so trialed switch to Ramelteon  though this was not helpful. Today, discussed mirtazapine  regimen to support anxiety and insomnia. Discussed potential for worsening of symptoms when starting SSRIs and patient understands to monitor for this risk. Discussed therapy though patient declines at this time. Discussed importance of sleep hygiene. Scheduled patient for 1 week follow up.

## 2024-05-16 ENCOUNTER — Telehealth: Payer: Self-pay

## 2024-05-16 ENCOUNTER — Telehealth: Admitting: Family Medicine

## 2024-05-16 DIAGNOSIS — F411 Generalized anxiety disorder: Secondary | ICD-10-CM

## 2024-05-16 DIAGNOSIS — R0683 Snoring: Secondary | ICD-10-CM | POA: Diagnosis not present

## 2024-05-16 DIAGNOSIS — G4700 Insomnia, unspecified: Secondary | ICD-10-CM

## 2024-05-16 NOTE — Progress Notes (Signed)
 Virtual Visit via Telephone Note  I connected with Julie Hensley on 05/16/24 at  3:30 PM EST by telephone and verified that I am speaking with the correct person using two identifiers.  Location: Patient: Julie Hensley - home Provider: Izetta Nap - [facility]   I discussed the limitations, risks, security and privacy concerns of performing an evaluation and management service by telephone and the availability of in person appointments. I also discussed with the patient that there may be a patient responsible charge related to this service. The patient expressed understanding and agreed to proceed.   History of Present Illness: Discussed the use of AI scribe software for clinical note transcription with the patient, who gave verbal consent to proceed.  GAD Insomnia Previously trialed on trazodone  was not effective. Initiated on mirtazapine  7.5 mg nightly on 05/10/2024. - Fragmented sleep with multiple awakenings and only a few hours of total sleep per night - Difficulty falling back asleep after awakening - Current sleep improved compared to baseline, but still inadequate - Vivid, distressing nightmares began two days after starting new medication, this has since improved - Anxiety improved more than sleep on current medication regimen  Snoring - Concern for sleep apnea/snoring - Family history of sleep apnea (brother diagnosed) - No sleep study performed due to lack of transportation and inability to drive, which has been a barrier to evaluation    Observations/Objective: Speaking in full sentences without difficulty Normal mood and affect  Assessment and Plan: Assessment & Plan Insomnia, unspecified type Generalized anxiety disorder Symptomatically improved on mirtazapine , some side effects of nightmares that has since improved.  Will continue on current therapy given improvement in sleep and anxiety. -Scheduled with PCP for 2-week follow-up, consider 31-month mirtazapine  refill  if continuing to do well Snoring Daytime fatigue, snoring and family history of OSA and brother requiring CPAP.  Agreeable to home sleep study given transportation significant barrier, ordered. - Home sleep study  I discussed the assessment and treatment plan with the patient. The patient was provided an opportunity to ask questions and all were answered. The patient agreed with the plan and demonstrated an understanding of the instructions.   The patient was advised to call back or seek an in-person evaluation if the symptoms worsen or if the condition fails to improve as anticipated.   Izetta Nap, MD

## 2024-05-16 NOTE — Assessment & Plan Note (Signed)
 Symptomatically improved on mirtazapine , some side effects of nightmares that has since improved.  Will continue on current therapy given improvement in sleep and anxiety. -Scheduled with PCP for 2-week follow-up, consider 60-month mirtazapine  refill if continuing to do well

## 2024-05-22 NOTE — Progress Notes (Signed)
 I provided 22 minutes of non-face-to-face time during this encounter.   Izetta Nap, MD

## 2024-05-31 ENCOUNTER — Encounter: Payer: Self-pay | Admitting: Family Medicine

## 2024-05-31 ENCOUNTER — Telehealth: Payer: Self-pay | Admitting: Family Medicine

## 2024-05-31 DIAGNOSIS — R0683 Snoring: Secondary | ICD-10-CM

## 2024-05-31 DIAGNOSIS — G4700 Insomnia, unspecified: Secondary | ICD-10-CM

## 2024-05-31 DIAGNOSIS — F411 Generalized anxiety disorder: Secondary | ICD-10-CM

## 2024-05-31 MED ORDER — MIRTAZAPINE 15 MG PO TABS: 15.0000 mg | ORAL_TABLET | Freq: Every day | ORAL | 1 refills | Status: AC

## 2024-05-31 NOTE — Assessment & Plan Note (Signed)
 Notable improvement in anxiety, mild improvement in sleep. Given good tolerance of medication at this time, discussed increase to mirtazapine  15 daily. Precautions with dosage increase discussed. Discussed therapy though patient not interested in therapy at this time. Sleep hygiene and supportive measures discussed.

## 2024-05-31 NOTE — Progress Notes (Signed)
 [facility] Telemedicine Visit  Patient consented to have virtual visit and was identified by name and date of birth. Method of visit: Telephone  Encounter participants: Patient: Julie Hensley - located at home Provider: Damien Cassis - located at [facility]  Chief Complaint: Insomnia, anxiety   HPI: Following up on insomnia, anxiety today Has been taking mirtazipine 7.5 daily  Was having nightmares at first - now resolved Was easier to fall asleep, still tough to stay asleep  Sleeps for an hour, up for 3, then an hour sleep again  Anxiety is better, mind doesn't wander as much  No SI/HI Not interested therapy at this time    Has caught self snoring a bit during naps sometimes Reports intermittent quick inhale for air when awake  No observation by others of patient snoring or pausing breathing when sleeping  Has not received sleep study yet - is still interested in completing this    ROS: per HPI  Pertinent PMHx: Anxiety, Insomnia   Exam:  There were no vitals taken for this visit.  Respiratory: Breathing comfortably. Speaking in full sentences.   Assessment/Plan:  Assessment & Plan Insomnia, unspecified type Generalized anxiety disorder Notable improvement in anxiety, mild improvement in sleep. Given good tolerance of medication at this time, discussed increase to mirtazapine  15 daily. Precautions with dosage increase discussed. Discussed therapy though patient not interested in therapy at this time. Sleep hygiene and supportive measures discussed.  Snoring Previously ordered home sleep study to investigate possibility of OSA.   Follow up in 3-4 weeks or sooner as needed.    Time spent during visit with patient: 25 minutes  Future Appointments  Date Time Provider Department Center  06/24/2024  2:30 PM Cassis Damien, MD [facility]  08/22/2024  2:00 PM Hunsucker, Donnice SAUNDERS, MD [facility] [phone]   Damien Cassis, MD

## 2024-06-24 ENCOUNTER — Telehealth: Payer: Self-pay | Admitting: Family Medicine

## 2024-08-22 ENCOUNTER — Ambulatory Visit: Admitting: Pulmonary Disease
